# Patient Record
Sex: Female | Born: 1970 | Race: Black or African American | Hispanic: No | Marital: Married | Smoking: Former smoker
Health system: Southern US, Community
[De-identification: ages and names within clinical notes are randomized; demographics above are authoritative.]

## PROBLEM LIST (undated history)

## (undated) DIAGNOSIS — K5090 Crohn's disease, unspecified, without complications: Secondary | ICD-10-CM

## (undated) DIAGNOSIS — D649 Anemia, unspecified: Secondary | ICD-10-CM

## (undated) DIAGNOSIS — F419 Anxiety disorder, unspecified: Secondary | ICD-10-CM

## (undated) DIAGNOSIS — G4700 Insomnia, unspecified: Secondary | ICD-10-CM

## (undated) DIAGNOSIS — K529 Noninfective gastroenteritis and colitis, unspecified: Secondary | ICD-10-CM

## (undated) DIAGNOSIS — G8929 Other chronic pain: Secondary | ICD-10-CM

## (undated) DIAGNOSIS — F32A Depression, unspecified: Secondary | ICD-10-CM

## (undated) HISTORY — PX: INDUCED ABORTION: SHX677

## (undated) HISTORY — DX: Anemia, unspecified: D64.9

## (undated) HISTORY — DX: Anxiety disorder, unspecified: F41.9

## (undated) HISTORY — PX: MOUTH SURGERY: SHX715

## (undated) HISTORY — DX: Depression, unspecified: F32.A

## (undated) HISTORY — DX: Insomnia, unspecified: G47.00

## (undated) HISTORY — DX: Noninfective gastroenteritis and colitis, unspecified: K52.9

## (undated) HISTORY — PX: ABDOMINAL HYSTERECTOMY: SHX81

## (undated) HISTORY — PX: COLONOSCOPY W/ BIOPSIES: SHX1374

## (undated) HISTORY — PX: COLON SURGERY: SHX602

## (undated) HISTORY — PX: UPPER GASTROINTESTINAL ENDOSCOPY: SHX188

## (undated) HISTORY — DX: Crohn's disease, unspecified, without complications: K50.90

## (undated) HISTORY — DX: Other chronic pain: G89.29

---

## 1999-11-20 ENCOUNTER — Other Ambulatory Visit: Admission: RE | Admit: 1999-11-20 | Discharge: 1999-11-20 | Payer: Self-pay | Admitting: Obstetrics

## 2000-07-23 ENCOUNTER — Inpatient Hospital Stay (HOSPITAL_COMMUNITY): Admission: AD | Admit: 2000-07-23 | Discharge: 2000-07-26 | Payer: Self-pay | Admitting: Obstetrics and Gynecology

## 2003-05-10 ENCOUNTER — Other Ambulatory Visit: Admission: RE | Admit: 2003-05-10 | Discharge: 2003-05-10 | Payer: Self-pay | Admitting: Obstetrics and Gynecology

## 2003-10-12 ENCOUNTER — Ambulatory Visit (HOSPITAL_COMMUNITY): Admission: RE | Admit: 2003-10-12 | Discharge: 2003-10-12 | Payer: Self-pay | Admitting: Obstetrics and Gynecology

## 2003-11-09 ENCOUNTER — Ambulatory Visit (HOSPITAL_COMMUNITY): Admission: RE | Admit: 2003-11-09 | Discharge: 2003-11-09 | Payer: Self-pay | Admitting: Obstetrics and Gynecology

## 2004-02-01 ENCOUNTER — Encounter (HOSPITAL_COMMUNITY): Admission: AD | Admit: 2004-02-01 | Discharge: 2004-03-02 | Payer: Self-pay | Admitting: Obstetrics and Gynecology

## 2004-02-01 ENCOUNTER — Ambulatory Visit (HOSPITAL_COMMUNITY): Admission: RE | Admit: 2004-02-01 | Discharge: 2004-02-01 | Payer: Self-pay | Admitting: Obstetrics and Gynecology

## 2004-02-12 ENCOUNTER — Ambulatory Visit (HOSPITAL_COMMUNITY): Admission: RE | Admit: 2004-02-12 | Discharge: 2004-02-12 | Payer: Self-pay | Admitting: Obstetrics and Gynecology

## 2004-02-25 ENCOUNTER — Encounter: Payer: Self-pay | Admitting: Obstetrics and Gynecology

## 2004-02-25 ENCOUNTER — Inpatient Hospital Stay (HOSPITAL_COMMUNITY): Admission: AD | Admit: 2004-02-25 | Discharge: 2004-02-27 | Payer: Self-pay | Admitting: Obstetrics and Gynecology

## 2004-02-26 ENCOUNTER — Encounter: Payer: Self-pay | Admitting: Internal Medicine

## 2004-03-19 ENCOUNTER — Encounter (INDEPENDENT_AMBULATORY_CARE_PROVIDER_SITE_OTHER): Payer: Self-pay | Admitting: *Deleted

## 2004-03-19 ENCOUNTER — Inpatient Hospital Stay (HOSPITAL_COMMUNITY): Admission: RE | Admit: 2004-03-19 | Discharge: 2004-03-22 | Payer: Self-pay | Admitting: Obstetrics and Gynecology

## 2005-01-24 ENCOUNTER — Ambulatory Visit (HOSPITAL_COMMUNITY): Admission: RE | Admit: 2005-01-24 | Discharge: 2005-01-24 | Payer: Self-pay | Admitting: Obstetrics and Gynecology

## 2005-02-17 ENCOUNTER — Other Ambulatory Visit: Admission: RE | Admit: 2005-02-17 | Discharge: 2005-02-17 | Payer: Self-pay | Admitting: Obstetrics and Gynecology

## 2005-05-29 ENCOUNTER — Emergency Department (HOSPITAL_COMMUNITY): Admission: EM | Admit: 2005-05-29 | Discharge: 2005-05-29 | Payer: Self-pay | Admitting: Emergency Medicine

## 2005-08-20 ENCOUNTER — Inpatient Hospital Stay (HOSPITAL_COMMUNITY): Admission: RE | Admit: 2005-08-20 | Discharge: 2005-08-23 | Payer: Self-pay | Admitting: Obstetrics and Gynecology

## 2006-09-22 DIAGNOSIS — G510 Bell's palsy: Secondary | ICD-10-CM | POA: Insufficient documentation

## 2007-07-30 ENCOUNTER — Emergency Department (HOSPITAL_COMMUNITY): Admission: EM | Admit: 2007-07-30 | Discharge: 2007-07-30 | Payer: Self-pay | Admitting: Emergency Medicine

## 2008-02-12 ENCOUNTER — Emergency Department (HOSPITAL_COMMUNITY): Admission: EM | Admit: 2008-02-12 | Discharge: 2008-02-12 | Payer: Self-pay | Admitting: Emergency Medicine

## 2008-02-13 ENCOUNTER — Emergency Department (HOSPITAL_COMMUNITY): Admission: EM | Admit: 2008-02-13 | Discharge: 2008-02-14 | Payer: Self-pay | Admitting: Emergency Medicine
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
## 2010-07-28 ENCOUNTER — Encounter: Payer: Self-pay | Admitting: Obstetrics and Gynecology

## 2010-11-22 NOTE — Consult Note (Signed)
NAMEJASMIA, [name]                  ACCOUNT NO.:  [id number]   MEDICAL RECORD NO.:  [id number]                   PATIENT TYPE:  INP   LOCATION:  9372                                 [facility]   PHYSICIAN:  [place] Bing, M.D.               DATE OF BIRTH:  Oct 08, 1970   DATE OF CONSULTATION:  02/26/2004  DATE OF DISCHARGE:                                   CONSULTATION   REFERRING PHYSICIAN:  Dr. Jaymes Graff.   HISTORY OF PRESENT ILLNESS:  A 40 year old woman with 34-week intrauterine  pregnancy referred for evaluation of chest pressure, dyspnea, and  palpitations.  [name] has no history of cardiac disease.  Her  exercise capacity was normal prior to pregnancy.  She is carrying twins and  has had nonspecific symptoms for some months with decreased exercise  tolerance, increased fatigue, increased dyspnea on exertion, episodic chest  tightness associated with palpitations, headache, backache, and abdominal  pain.  These became more frequent and severe on Sunday, prompting her to  call the nurse practitioner, who recommended admission.   There is no history of cardiac disease or any significant cardiac testing.  She has not previously been seen by a cardiologist.  She has no  hypertension, no diabetes, and no known hyperlipidemia.  She has never had  witnessed episodes of palpitations - no arrhythmia was noted.   She has no known allergies.  Her only medications prior to admission were  Valtrex and vitamins.   PAST MEDICAL HISTORY:  Notable for migraines, irritable bowel, and herpes  simplex 2 infection.  She had asthma as a child.  Her only surgeries have  been a previous C-section and oral surgery.   SOCIAL HISTORY:  Lives in [place] with her husband.  Works for [employer].  Denies tobacco or alcohol use.   FAMILY HISTORY:  Mother has hypertension.  No history of diabetes, coronary  disease, or sudden death.   REVIEW OF SYSTEMS:  Notable for  intermittent episodes of diaphoresis,  frequent headaches, urinary frequency, and mild GERD symptoms.  All other  systems reviewed and are negative.   PHYSICAL EXAMINATION:  GENERAL:  Well-appearing, well-proportioned woman in  no acute distress.  VITAL SIGNS:  The heart rate is 90 and regular, respirations 20, blood  pressure 105/50, temperature 98.  HEENT:  Anicteric sclerae; normal lids and conjunctivae.  NECK:  No jugular venous distention; normal carotid upstrokes without  bruits.  ENDOCRINE:  No thyromegaly.  HEMATOPOIETIC:  No cervical, axillary, or inguinal adenopathy.  LUNGS:  Clear.  CARDIAC:  Normal first and second heart sounds.  Basilar 2/6 systolic  ejection murmur.  ABDOMEN:  Gravid uterus.  Firm and nontender.  EXTREMITIES:  No edema.  Normal distal pulses.  NEUROMUSCULAR:  Symmetric strength and tone.   EKG:  Sinus tachycardia; otherwise, normal.   Spiral CT of the chest:  No evidence for pulmonary embolism; small  pericardial effusion and bilateral pleural effusions.  Other laboratory notable for hemoglobin of 10, potassium of 3.4, creatinine  of 0.6, glucose of 100, and TSH of 1.8.   Preliminary echocardiogram report apparently shows normal left ventricular  systolic function.   IMPRESSION:  Ms. [name] presents with palpitations which have been  demonstrated not to represent a significant arrhythmia.  She has symptoms of  possible cardiomyopathy, but left ventricular systolic function is normal.  She is left with small pleural effusions, probably an insignificant  pericardial effusion, chest discomfort, and dyspnea.  The differential is  broad, but most potentially serious etiologies have been excluded.  Coronary  disease is remote and likely would be congenital if present.  A CPK level  and troponin level will be obtained.  There is also the possibility of an  early perimyocarditis.  This does not usually present with chest tightness  unless congestive  heart failure is present.   Symptoms are potentially consistent with coronary artery spasm.  It would be  useful to record an EKG during chest tightness.  Nitroglycerin can be given  in the way of a therapeutic trial.  This would also be effective for  esophageal spasm.  A BNP level will also be obtained.  CHF appears quite  unlikely in the absence of suggestive physical findings or pulmonary edema  on CT scanning.   The possibility exists of an inflammatory disease.  ANA, rheumatoid factor,  and ESR will be obtained.  Due to dyspnea, arterial blood gas on room air  will be obtained.  If results of this testing are unrevealing, as expected,  consideration can be given to discharge in the morning.  We will plan to see  her again tomorrow.                                               [place] Bing, M.D.    RR/MEDQ  D:  02/26/2004  T:  02/26/2004  Job:  [id number]

## 2010-11-22 NOTE — Discharge Summary (Signed)
NAMEMARLANE, HIRSCHMANN                  ACCOUNT NO.:  [id number]   MEDICAL RECORD NO.:  [id number]                   PATIENT TYPE:  INP   LOCATION:  9104                                 [facility]   PHYSICIAN:  Osborn Coho, M.D.                DATE OF BIRTH:  [birth year]   DATE OF ADMISSION:  03/19/2004  DATE OF DISCHARGE:                                 DISCHARGE SUMMARY   ADMITTING DIAGNOSES:  1.  Intrauterine pregnancy with twins at term.  2.  Previous cesarean section with desire for repeat.   DISCHARGE DIAGNOSES:  1.  Intrauterine pregnancy at term with twins.  2.  Prior cesarean section, desired repeat.  3.  Fibroid.  4.  Anemia without hemodynamic compromise.   PROCEDURES:  Repeat low transverse cesarean section.   HOSPITAL COURSE:  Ms. Crookshanks is a 40 year old gravida 4 para 1-0-2-1 at  52 weeks who presented for scheduled repeat cesarean section secondary to  twin pregnancy.  The patient's history was remarkable for:  1.  Previous cesarean section.  2.  Twin gestation.  3.  History of HSV.  4.  History of migraines.  5.  History of asthma.   The patient was taken to the operating room on March 19, 2004 where a  repeat low transverse cesarean section was performed by Dr. Marline Backbone  under spinal anesthesia.  Findings were a viable twin pregnancy.  Twin A was  a female by the name of Ahijah, 6 pounds 6 ounces, Apgars were 9 and 9.  Twin  B was a female, Rolm Gala, weight 7 pounds 4 ounces, and Apgars were 8 and 9.  There was a 4 cm fibroid noted.  Normal tubes and ovaries were noted.  Estimated blood loss was 800 mL.  The patient tolerated the procedure well  and was taken to the recovery room in good condition.  The infants were  taken to full-term nursery.  The patient elected breastfeeding.  By  postoperative day #1 the patient was doing well.  She was up ad lib.  Her  hemoglobin was 8.1, down from 9.8.  She had no dizziness or syncope.   She  declined transfusion.  She was breastfeeding.  The patient declined hormonal  contraception.  During the rest of her course, her physical exam was within  normal limits, her vital signs were stable.  By postoperative day #3 she was  up ad lib, she was passing gas and tolerating a regular diet.  She had had a  JP drain that drained approximately 20 mL per 24-hour shift.  This was  removed on postoperative day #3 prior to discharge.  The patient's incision  was clean, dry, and intact with Steri-Strips.  She had subcuticular sutures  in place.  The rest of her physical exam was within normal limits.  She was  deemed to have received the full benefit of her hospital stay and was  discharged home.  DISCHARGE INSTRUCTIONS:  Per [facility] handout.   DISCHARGE MEDICATIONS:  1.  Motrin 600 mg p.o. q.6h. p.r.n. pain.  2.  Tylox one to two p.o. q.4-6h. p.r.n. pain.  3.  The patient is also going to use vitamin B6 to assist with elimination      of excess fluid.   DISCHARGE FOLLOW-UP:  In 6 weeks at [facility].   CONDITION ON DISCHARGE:  Stable.     Renaldo Reel Emilee Hero, C.N.M.                   Osborn Coho, M.D.    VLL/MEDQ  D:  03/22/2004  T:  03/22/2004  Job:  [id number]

## 2010-11-22 NOTE — H&P (Signed)
[name], [name]        ACCOUNT NO.:  [id number]   MEDICAL RECORD NO.:  [id number]          PATIENT TYPE:  INP   LOCATION:  NA                            [facility]   PHYSICIAN:  Janine Limbo, M.D.DATE OF BIRTH:  1971/04/11   DATE OF ADMISSION:  08/22/2005  DATE OF DISCHARGE:                                HISTORY & PHYSICAL   HISTORY OF PRESENT ILLNESS:  The patient is a 40 year old female, gravida 5,  para 2-0-2-3, who presents at 9 weeks' gestation ([facility] is September 02, 2005.) The patient has been followed at the [facility].  She presents for a  repeat cesarean section.  The patient has a history of two prior cesarean  deliveries.  This pregnancy has been complicated by Bell palsy that is  slowly resolving.   OBSTETRICAL HISTORY:  1.  The patient had a cesarean section, in 2002, at term where she delivered      an 8 pound 10 ounce female infant (history of HSV outbreak.)  2.  In 2005, the patient had a cesarean section at term where she delivered      twins.  3.  The patient had a first trimester miscarriage in 2004.  4.  She had a first trimester pregnancy termination in 1995.   DRUG ALLERGIES:  No known drug allergies.   SOCIAL HISTORY:  The patient denies cigarette use, alcohol use, and  recreational drug use.   REVIEW OF SYSTEMS:  Normal pregnancy complaints.   PAST MEDICAL HISTORY:  1.  The patient has Bell palsy as mentioned above.  2.  She has a history of HSV.  3.  She also has a history of asthma.  4.  She has had two cesarean deliveries as previously discussed.  5.  She has a history of irritable bowel syndrome.  6.  She has a history of migraines.   FAMILY HISTORY:  The patient's mother and her sister have diabetes.  Her  mother has hypertension.   PHYSICAL EXAMINATION:  VITAL SIGNS:  Weight is 214 pounds, height is 6 feet.  HEENT:  Within normal limits.  CHEST:   Clear.  HEART:  Regular rate and rhythm.  BREASTS:  Without masses.  ABDOMEN:  Gravid and the fundal height is 37-cm.  EXTREMITIES:  Within normal limits.  NEUROLOGIC:  Grossly normal.  PELVIC:  Cervix was closed and long when last checked.   LABORATORY VALUES:  Blood type is B positive.  Antibody screen negative.  Sickle cell negative.  VDRL nonreactive.  Rubella immune.  HBSAG negative.  Pap within normal limits.  Third trimester beta strep is negative.  Third  trimester gonorrhea negative.  Third trimester Chlamydia is negative.   ASSESSMENT:  1.  A 38-week gestation.  2.  Two prior cesarean deliveries.  3.  Desires repeat cesarean delivery.   PLAN:  The patient will undergo a repeat low transverse cesarean section.  She understands the indications for her procedure, and she accepts the risk  of, but not limited to, anesthetic complications, bleeding, infection, and  possible  damage to the surrounding organs.      Janine Limbo, M.D.  Electronically Signed     AVS/MEDQ  D:  08/15/2005  T:  08/15/2005  Job:  [id number]

## 2010-11-22 NOTE — Discharge Summary (Signed)
[name]        ACCOUNT NO.:  [id number]   MEDICAL RECORD NO.:  [id number]          PATIENT TYPE:  INP   LOCATION:  9105                          [facility]   PHYSICIAN:  Osborn Coho, M.D.   DATE OF BIRTH:  [birth year]   DATE OF ADMISSION:  08/20/2005  DATE OF DISCHARGE:  08/23/2005                                 DISCHARGE SUMMARY   ADMISSION DIAGNOSES:  1.  Intrauterine pregnancy at 38 weeks.  2.  Active labor.  3.  Prior Cesarean section and desire for repeat Cesarean section.   DISCHARGE DIAGNOSES:  1.  Intrauterine pregnancy at 38 weeks.  2.  Active labor.  3.  Prior Cesarean section and desire for repeat Cesarean section.   PROCEDURE:  Repeat low transverse Cesarean section.   HOSPITAL COURSE:  [name] is a 40 year old married black female,  gravida 5, para 2-0-2-3, who was admitted at 38-1/7 weeks from [place] in active labor.  She denies leaking or bleeding.  Her  cervix was 5 cm on examination in the office.  There were no current HSV  signs or symptoms.  Her pregnancy has been followed by the [facility] service and had been remarkable for (1) history of previous Cesarean  section X2, the second Cesarean section for twins, (2) first trimester  spotting, (3) questionable last menstrual period, (4) history of HSV2, (5)  history of irritable bowel syndrome, (6) migraines.  Upon arrival to [facility] the patient was prepped for Cesarean section and was taken to the  operating room.  The patient tolerated the procedure well.  Infant was a  viable female by the name of [name] with Apgar's of 9 and 9 and weight of 7  pounds, 6 ounces.  He was taken to the full term nursery and was doing well.  The patient was taken to the recovery room in good condition.  By  postoperative day #1, her vital signs were stable.  Her hemoglobin was 8.3,  and had been 10.1 preoperatively.  Physical examination was normal.  The  patient was breastfeeding.  Her postoperative day #2 was uneventful.  By day  #3 the patient's JP drain was removed without incident.  She was deemed to  have received the full benefit of her hospital stay and was discharged home.  The patient expressed desire for oral contraceptives for birth control.   DISCHARGE MEDICATIONS:  1.  Motrin 600 mg one p.o. q.6h. PRN pain.  2.  Tylox one to two p.o. q.3-4h. PRN pain.  3.  Prenatal vitamins one p.o. daily.  4.  Micronor one p.o. daily to start two Sunday's after delivery.   DISCHARGE INSTRUCTIONS:  Per [facility] handout.   FOLLOW UP:  Discharge follow up will occur at [facility] in  four to six weeks postpartum or as needed.      Cam Hai, C.N.M.      Osborn Coho, M.D.  Electronically Signed    KS/MEDQ  D:  08/23/2005  T:  08/23/2005  Job:  [id number]

## 2010-11-22 NOTE — H&P (Signed)
NAMEJANEISHA, RYLE                  ACCOUNT NO.:  [id number]   MEDICAL RECORD NO.:  [id number]                   PATIENT TYPE:  OUT   LOCATION:  CATS                                 FACILITY:  [facility]   PHYSICIAN:  Naima A. Dillard, M.D.              DATE OF BIRTH:  09-13-1970   DATE OF ADMISSION:  02/25/2004  DATE OF DISCHARGE:                                HISTORY & PHYSICAL   HISTORY OF PRESENT ILLNESS:  Ms. Truszkowski is a 40 year old gravida 4,  para 1-0-2-1. She is at 34-1/[redacted] weeks gestation with twins. She presents  today with occasional episodes of chest tightness, shortness of breath,  headache, and heart palpitations since Friday, February 22, 2004. She has had  an increase in these number of episodes today and called in to report these  symptoms at 12:30 this afternoon. She has not previously had any symptoms  such as she is having now. She dose not have any upper respiratory infection  or cold symptoms. She does not have any nausea, vomiting or diarrhea. She is  having a normal daily bowel movement and eating normally. She does  experience some gastric reflux symptoms and takes Tums appropriately. She  has had a recent HSV outbreak, starting on Friday, February 22, 2004.  Otherwise, she denies any continuous contractions, leaking of fluid, vaginal  bleeding, or anxiety. She has been on bedrest at home for advanced cervical  dilation. On Friday, February 22, 2004 she was noted to be 2 cm dilated and  60% effaced. She describes these episodes as beginning with chest tightness  and shortness of breath. Soon after the chest tightness and shortness of  breath, the patient begins to have heart palpitations and can hear and feel  her heartbeat pounding in her chest. Then her head begins to pound as she  gets a headache with heartbeat again pounding in her head. Following the  headache, she begins to have abdominal cramping, which then radiates to her  back/her tail bone area.  This area begins pulsating and pounding like a  heartbeat in her tail bone area and then increases throughout her back to  the middle of her back, at which point the symptoms all seem to fade away.  The combination of all of these symptoms in one episodes lasts approximately  1 minute. She began having these episodes on Friday a couple of times and  several more on Saturday and the frequency of these episodes has increased  today. The patient is currently not shortness of breath at rest. Her babies  are both reactive on monitor. She is contracting very occasionally,  approximately every 20 to 40 minutes. Her O2 saturation on room air has been  93% to 96%.   GYNECOLOGIC HISTORY:  Her pregnancy has been followed by the MD Service at  [facility] and Gynecologic Services and is remarkable for:  1. Migraines.  2. Previous cesarean section.  3. HSV-2.  4.  Vegetarian.  5. Question last menstrual period.  6. Twins.  This patient began prenatal care at [facility] and  Gynecologic Services on October 03, 2003. Estimated date of confinement  determined by ultrasound which noted twin gestation at 14 weeks 5 days with  an estimated date of confinement of April 06, 2004. Her pregnancy has been  complicated with migraine headaches. She has had neurology consult and at  the present time, she has not had migraine headaches in greater than 1  month. She has had increased pelvic pain and pressure throughout her  pregnancy. She had contractions and a fetal fibronectin, which was negative  on January 11, 2004. She was seen on February 01, 2004 at which point she had an  ultrasound showing dichorionic diamniotic twins. Twin A had an estimated  fetal weight of 1772 grams at the 75th to 90th percentile with normal fluid,  vertex. Twin B had an estimated fetal weight of 1837 at the 75th to 90th  percentile and also had normal fluid. Twin B was also vertex. Her cervix at  that time was  measured at 1.8 cm. She received betamethasone on that day and  the following day, 24 hours later. It was at that point that she was begun  on bedrest. She has been normotensive throughout her pregnancy with no  proteinuria.   OBSTETRIC HISTORY:  In 1995, the patient had a first trimester elective AB.  In 2002, the patient had a low transverse cesarean section at term with the  birth of an 8 pound 10 ounce female infant named Hospital doctor. She had the cesarean  section secondary to an HSV outbreak. In 2004, the patient had a first  trimester SAB and the current pregnancy with twin gestation.   PAST MEDICAL HISTORY:  The patient has a history of HSV-2 and is currently  taking Valtrex for an outbreak, which occurred on February 22, 2004. She has a  history of irritable bowel syndrome and migraines. She had a history of  asthma as a child.   FAMILY HISTORY:  The patient's mother with chronic hypertension. Mother,  sister with diabetes. Maternal grandmother with uterine cancer.   GENETIC HISTORY:  Unremarkable. There is no family history of genetic or  chromosomal disorders, children that died in infancy or that were born with  birth defects.   ALLERGIES:  No known drug allergies.   SOCIAL HISTORY:  She denies the use of tobacco, alcohol, or illicit drugs.   SOCIAL HISTORY:  Ms. Yetman is a married African-American female. Her  husband, Fidela Juneau is involved and supportive. They do not  subscribe to a religious faith.   REVIEW OF SYSTEMS:  Is as described above. The patient has experienced chest  pain, shortness of breath, contractions and headache.   PHYSICAL EXAMINATION:  VITAL SIGNS:  Temperature 98.4, pulse 90 to 110 and  respiratory rate 20. Her O2 saturation on room air is 93% to 96%.  HEENT:  Unremarkable.  HEART:  Per EKG, shows sinus tachycardia.  LUNGS:  Clear bilaterally. ABDOMEN:  Gravid in its contour. Uterine fundus is noted to extend 38 cm  above the level of  the pubic synthesis. The patient is known to have a twin  gestation on previous ultrasound. Babies were in a vertex/vertex position.  Fetal heart rate per monitoring:  Twin A baseline is 150's with average long  term variability. Baby is reactive. Twin B baseline is 140 to 150, positive  variability and is also  reactive. The patient is contracting very  irregularly and mildly approximately every 20 to 40 minutes. Abdomen is soft  and nontender. CVAT is negative bilaterally.  EXTREMITIES:  She has no edema. Deep tendon reflexes are 1+ with no clonus.  She has no calf tenderness.  PELVIC:  Digital examination of the cervix finds it to be 2 cm dilated, 60%  effaced with a presenting part high in the pelvis.   ASSESSMENT:  1. Intrauterine pregnancy at 34-1/7 weeks with chest pain, shortness of     breath, contractions and headache episodes.  2. Twins.  3. Active HSV outbreak.   PLAN:  Admit to Intensive Care Unit per Dr. Jaymes Graff with orders as  written. Lab work has been ordered as well as ultrasound and spiral CT.     Rica Koyanagi, C.N.M.               Naima A. Normand Sloop, M.D.    SDM/MEDQ  D:  02/25/2004  T:  02/26/2004  Job:  [id number]

## 2010-11-22 NOTE — Discharge Summary (Signed)
[facility]  Patient:    [name], [name]                 MRN: [id number] Adm. Date:  [id number] Disc. Date: 07/26/00 Attending:  Shaune Spittle Dictator:   Mack Guise, C.N.M.                           Discharge Summary  ADMITTING DIAGNOSES:          1. Term pregnancy, active labor.                               2. Herpes simplex virus.  DISCHARGE DIAGNOSES:          1. Term pregnancy, delivered.                               2. Primary low transverse cesarean section.                               3. Herpes simplex virus.                               4. Fibroids.  PROCEDURE:                    Primary low transverse cesarean delivery.  HISTORY/HOSPITAL COURSE:      [name] is a 40 year old, gravida 2 , para 0-0-1-0 at term, who presents in early active labor with history of HSV and a questionable lesion the day prior to labor in the office. A decision was made to undergo primary low transverse cesarean delivery in light of questionable lesion. She underwent primary low transverse cesarean delivery on July 23, 2000 by Dr. Marline Backbone with the birth of an 8-pound 10-ounce female infant, [name], with Apgar scores of 8 at one minute and 9 at five minutes.  She has done well in the postoperative period. Her hemoglobin on the first postoperative day was 9.4 and on this, her third postoperative day, she is judged to be in satisfactory condition for discharge.  DISCHARGE INSTRUCTIONS:       Per [facility] handout.  DISCHARGE MEDICATIONS:        1. Motrin 600 mg p.o. q.6h. p.r.n. pain.                               2. Tylox one to two p.o. q.3-4h. pain.                               3. Prenatal vitamins.                               4. Patient plans foam and condoms for                                  contraception.  FOLLOWUP:                     She will be  followed in six weeks postoperatively in the  office of [facility]. DD:  07/26/00 TD:  07/26/00 Job: [id number]

## 2010-11-22 NOTE — Op Note (Signed)
[facility]  Patient:    [name], [name]                 MRN: [id number] Proc. Date: 07/23/00 Adm. Date:  [id number] Disc. Date: [id number] Attending:  Dierdre Forth Pearline                           Operative Report  PREOPERATIVE DIAGNOSES:       1. Term intrauterine pregnancy.                               2. Active labor.                               3. Suspicious herpes simplex virus outbreak.   POSTOPERATIVE DIAGNOSES:      1. Term intrauterine pregnancy.                               2. Active labor.                               3. Suspicious herpes simplex virus outbreak.                               4. Fibroid uterus.  PROCEDURE:                    Primary low transverse cesarean section.  SURGEON:                      Janine Limbo, M.D.  FIRST ASSISTANT:              Mack Guise, C.N.M.  ANESTHESIA:                   Spinal.  DISPOSITION:                  The patient is a 40 year old female, gravida 2, para 0-0-1-0, who presents at [redacted] weeks gestation (EDC is July 24, 2000) in active labor.  The patient has a history of of HSV, and she has a suspicious HSV outbreak.  She understands the indications for her cesarean delivery, and she accepts the risks of, but not limited to, anesthetic complications, bleeding, infection, and possible damage to the surrounding organs.  FINDINGS:                     An 8 pounds 10 ounce female infant Glenda Chroman) was delivered.  The Apgars were 8 at one minute and 9 at five minutes.  There was a 4 cm fibroid present on the left anterior uterus and a 3 cm fibroid present on the left fundus.  The fallopian tubes and ovaries were normal.  DESCRIPTION OF PROCEDURE:     The patient was taken to the operating room where a spinal anesthetic was given.  The patients abdomen, perineum, and outer vaginal were prepped with multiple layers of Betadine.  A Foley catheter catheter was placed in the  bladder.  The patient was sterilely draped.  A low transverse incision was made in the abdomen and carried sharply through the subcutaneous tissue, the fascia,  and the anterior peritoneum.  An incision was made in the lower uterine segment and extended transversely.  The fetal head delivered.  The mouth and nose were suctioned using a DeLee trap and the bulb suction because of light meconium fluid.  The remainder of the infant was delivered, and the infant was handed to the waiting pediatric team.  Routine core blood studies were obtained.  The placenta was removed.  The uterine cavity was cleaned of amniotic fluid and clotted blood.  The uterine incision was closed using a running locking suture of 2-0 Vicryl.  Hemostasis was adequate.  The peritoneal cavity was vigorously irrigated.  The abdominal musculature and the anterior peritoneum were reapproximated in the midline using 2-0 Vicryl.  The abdominal musculature and the fascia were irrigated, and the hemostasis was adequate.  The fascia was closed using a running suture of 0 Vicryl followed by three interrupted sutures of 0 Vicryl.  The subcutaneous tissue was irrigated.  Hemostasis was achieved using the Bovie cautery.  The subcutaneous layer was closed using a running suture of 3-0 Vicryl.  The skin was reapproximated using skin staples.  Sponge, needle and instrument counts were correct on two occasions.  The estimated blood loss was 700 cc.  The patient tolerated the procedure well.  She was taken to the recovery room in stable condition.  The infant was taken to the full term nursery in stable condition. DD:  07/23/00 TD:  07/24/00 Job: [id number]

## 2010-11-22 NOTE — Discharge Summary (Signed)
NAMEKRISTYNE, Bell                  ACCOUNT NO.:  [id number]   MEDICAL RECORD NO.:  [id number]                   PATIENT TYPE:  OUT   LOCATION:  CATS                                 FACILITY:  [facility]   PHYSICIAN:  Osborn Coho, M.D.                DATE OF BIRTH:  06/29/1971   DATE OF ADMISSION:  02/25/2004  DATE OF DISCHARGE:  02/27/2004                                 DISCHARGE SUMMARY   ADMITTING DIAGNOSES:  1. Intrauterine pregnancy at 34-1/7 weeks with twin gestation.  2. Chest pain, shortness of breath, contractions, and headache episodes.  3. Active herpes simplex virus outbreak.   DISCHARGE DIAGNOSES:  1. Twins a 34-2/7 weeks.  2. Shortened cervix of 2 cm dilated.  3. Shortness of breath, chest pain, with no evidence of pulmonary embolus or     cardiomyopathy.  4. Pericardial and pleural effusions.   PROCEDURES:  None.   HOSPITAL COURSE:  Ms. [name] is a 40 year old gravida 4, para  1-0-2-1 at 34-1/7 weeks with twins who was admitted on 02/25/04 with episodes  of chest pain and shortness of breath.  She denied any other symptoms.  She  has had a recent HSV outbreak since Friday, 8/18.  Her history had been  remarkable for:   1. Migraines.  2. Previous cesarean section.  3. HSV 2 with current outbreak.  4. Preterm labor with cervix 2 cm, 60% on Friday, 8/18.  5. Questionable last menstrual period.  6. Twin gestation.   On admission, her cervix was 2 cm, 60% with presenting part high.  Fetuses  were in the vertex-vertex presentation.  Twin fetal heart tones were  reactive.  O2 saturation was 93-96% on room air.  Other vital signs were  stable.  EKG showed sinus tachycardia.  Lungs were clear.   The patient was admitted for observation and a spiral CT and cardiology  workup.  The spiral CT showed no PE.  There were bilateral small pleural  effusions and small pericardial effusion.  Twins were within normal limits  for position and growth.   Cervix was 2.4 cm.  TSH was normal.  Hemoglobin  was 9.9.  Potassium was normal.  BUN was normal.  Alkaline phosphatase was  643 which is probably placental.  Total protein and albumin were slightly  abnormal which may be nutritional since the patient is a vegetarian.  A 2-D  echocardiogram was done.  A cardiology consult was obtained by [facility]  cardiology.  Her overall evaluation with echocardiogram and CT was reviewed  showed minimal effusions, normal labs, and no clear cardiopulmonary disease.  The patient was seen each day of her hospital stay by cardiology and on  02/27/04, she had a ABGs done which were normal and she was felt stable  enough to be discharged home.  Dr.  Su Hilt saw the patient on 8/23.  O2  saturations were 97-98% on room air.  Vital signs were stable.  Urine  output  was good.  There were no contractions and fetal heart rates were reactive x2  on NST.  She was still having some episodes of mild chest tightness.  She  was determined to have received full benefit of her hospital stay and was  discharged home.   DISCHARGE INSTRUCTIONS:  The patient is to continue bed rest.  Signs and  symptoms of elevated blood pressure and worsening shortness of breath and  chest pain were reviewed with the patient.   PRESCRIPTION MEDICATIONS:  Prenatal vitamin, one p.o. daily.   DISCHARGE FOLLOWUP:  Will occur as scheduled at [facility] on this  Friday as scheduled and follow up with cardiology as needed.     Renaldo Reel Emilee Hero, C.N.M.                   Osborn Coho, M.D.    VLL/MEDQ  D:  02/27/2004  T:  02/28/2004  Job:  [id number]

## 2010-11-22 NOTE — Op Note (Signed)
NAMEGIRLIE, VELTRI        ACCOUNT NO.:  [id number]   MEDICAL RECORD NO.:  [id number]          PATIENT TYPE:  INP   LOCATION:  9105                          FACILITY:  [facility]   PHYSICIAN:  Hal Morales, M.D.DATE OF BIRTH:  1970/08/14   DATE OF PROCEDURE:  08/20/2005  DATE OF DISCHARGE:                                 OPERATIVE REPORT   PREOPERATIVE DIAGNOSIS:  Intrauterine pregnancy at 71 weeks' gestation,  active labor, prior cesarean section, desire for repeat cesarean section.   POSTOPERATIVE DIAGNOSES:  Intrauterine pregnancy at 1 weeks' gestation,  active labor, prior cesarean section, desire for repeat cesarean section.   OPERATION:  Repeat low transverse cesarean section.   SURGEON:  Hal Morales, M.D.   FIRST ASSISTANT:  Rhona Leavens, CNM   ANESTHESIA:  Spinal.   ESTIMATED BLOOD LOSS:  Approximately 750 mL.   COMPLICATIONS:  None.   FINDINGS:  The patient was delivered of a female infant whose name is [place]  with Apgars of 09/09. The weight was pending at the time of dictation.   PROCEDURE:  The patient was taken to the operating room after appropriate  identification and placed on the operating table. After the placement of a  spinal anesthetic she was placed in the supine position with a left lateral  tilt. The abdomen and perineum were prepped with multiple layers of Betadine  and a Foley catheter inserted into the bladder and connected to straight  drainage. The abdomen was draped as a sterile field. The area over the  previous cesarean section incision was infiltrated with 0.25% Marcaine for  total of 20 mL once adequate anesthesia had been assured. A transverse  incision was made at the site of the previous cesarean section incision and  the abdomen opened in layers. Peritoneum was entered and the bladder blade  placed. The bladder flap was created by incising the serosa at the  uterovesical fold and the bladder advanced. The uterus was then  incised in  the lower uterine segment and that incision taken laterally bluntly. The  membranes were ruptured with clear fluid noted and the infant delivered from  the occiput transverse position. The nares and pharynx were suctioned and  cord clamped and cut. The infant was then handed off to the awaiting  pediatricians. The appropriate cord blood was drawn and the placenta noted  to have separated from the uterus and was removed from the operative field.  The uterine cavity was cleared of products of conception and the uterine  incision closed with a running interlocking suture of 0 Vicryl then an  imbricating suture of 0 Vicryl was placed. A hemostatic suture of 0 Vicryl  in figure-of-eight configuration allowed for adequate hemostasis. Copious  irrigation was carried out. The tubes and ovaries were noted to be normal  for the gravid state. The abdominal peritoneum was then closed with a  running suture of 2-0 Vicryl. The rectus muscles were reapproximated in  midline with figure-of-eight suture of 2-0 Vicryl. The rectus fascia was  closed with running suture of 0 Vicryl and reinforced on either side of  midline with figure-of-eight sutures of  0 Vicryl. The subcutaneous tissue  was irrigated and made hemostatic with Bovie cautery.  A 7 mm Jackson-Pratt  drain was placed in the subcutaneous space through a stab wound in the left  lower quadrant. It was sewn in place with a suture of 2-0 silk. The skin  incision was then closed with a subcuticular suture. Steri-Strips were  applied to the incision and a sterile dressing applied. The patient was  taken from the operating room to the recovery room in satisfactory condition  having tolerated the procedure well with sponge and instrument counts  correct. The infant went to the full-term nursery.      Hal Morales, M.D.  Electronically Signed     VPH/MEDQ  D:  08/20/2005  T:  08/21/2005  Job:  [id number]

## 2010-11-22 NOTE — Op Note (Signed)
[name], [name]                  ACCOUNT NO.:  [id number]   MEDICAL RECORD NO.:  [id number]                   PATIENT TYPE:  INP   LOCATION:  9104                                 [facility]ACILITY[facility]   PHYSICIAN:  [name], M.D.            DATE OF BIRTH:  07-Apr-1971   DATE OF PROCEDURE:  03/19/2004  DATE OF DISCHARGE:                                 OPERATIVE REPORT   PREOPERATIVE DIAGNOSES:  1.  Term intrauterine pregnancy.  2.  Twin gestation.  3.  Prior cesarean section.  4.  Desires repeat cesarean section.   POSTOPERATIVE DIAGNOSES:  1.  Term intrauterine pregnancy.  2.  Twin gestation.  3.  Prior cesarean section.  4.  Desires repeat cesarean section.   OPERATION PERFORMED:  Repeat low transverse cesarean section.   SURGEON:  [name], M.D.   ASSISTANT:  [name]. [name], C.N.M.   ANESTHESIA:  Spinal.   INDICATIONS FOR PROCEDURE:  [name] is a 40 year old female, gravida  4, para 1-0-2-1, who presents at 37-1/[redacted] weeks gestation.  She has been  followed at the [facility] of  [facility] for this pregnancy that has been complicated  by the fact that she has twins.  The patient understands the indications for  cesarean section and she accepts the risks of, but not limited to anesthetic  complications, bleeding, infections, and possible damage to the surrounding  organs.   FINDINGS:  The first infant born was a female and his name is [name].  He was  a 6 pound, 6 ounce infant with Apgars of 9 at one minute and 9 at five  minutes.  He was in a cephalic presentation.  The second baby was a 7 pound  4 ounce female infant, [name].  That infant was in a vertex presentation.  The Apgars were 8 at one minute and 9 at five minutes.  There was a 4 cm  fibroid present on the anterior portion of the uterus.  There were several  smaller fibroids measuring less than 2 cm in size.  The  fallopian tubes and  the ovaries appeared normal for the gravid state.   DESCRIPTION OF PROCEDURE:  The patient was taken to the operating room where  a spinal anesthetic was given.  The patient's abdomen and perineum were  prepped with multiple layers of Betadine.  A Foley catheter was placed in  the bladder.  The patient was sterilely draped.  The lower abdomen was  injected with 10 mL of 0.5% Marcaine with epinephrine.  A low transverse  incision was made and carried sharply through the subcutaneous tissue, the  fascia and the anterior peritoneum.  An incision was made in the lower  uterine segment and the bladder flap was developed.  The incision was  extended in a low transverse fashion.  The fetal head was delivered without  difficulty.  The cord was clamped and  cut and the infant was handed to the  waiting pediatric team.  The membranes were ruptured on the second infant  and that infant was also delivered from the cephalic presentation.  The  mouth and nose were suctioned. The remainder of that infant was delivered.  The cord was clamped and cut and the infant was handed to the waiting  pediatric team.  Routine cord blood studies were obtained from both  umbilical cords.  The placenta was removed and sent to pathology.  The  uterine cavity was cleaned of amniotic fluid, clotted blood and membranes.  The uterine incision was closed using a running locking suture of 2-0 Vicryl  followed by an imbricating suture of 2-0 Vicryl.  Hemostasis was adequate.  The pelvis was vigorously irrigated.  The anterior peritoneum and the  abdominal musculature were reapproximated in the midline using 2-0 Vicryl.  The fascia and the subcutaneous layer were irrigated.  The fascia was closed  using a running suture of 0 Vicryl followed by three interrupted sutures of  0 Vicryl.  A Jackson-Pratt drain was placed in the subcutaneous space and  brought out through the left lower quadrant.  It was sutured  into place  using 2-0 silk.  The subcutaneous layer was closed with 0 Vicryl.  The skin  was reapproximated using a subcuticular suture of 4-0 Vicryl.  Sponge,  needle and instrument counts were correct on two occasions.  The estimated  blood loss for this procedure was 800 mL.  The patient tolerated the  procedure well.  She was taken to the recovery room in stable condition.  Both infants were taken to the full term nursery in stable condition.                                               [name], M.D.    AVS/MEDQ  D:  03/19/2004  T:  03/19/2004  Job:  [id number]

## 2010-11-22 NOTE — H&P (Signed)
[name], [name]                  ACCOUNT NO.:  [id number]   MEDICAL RECORD NO.:  [id number]                   PATIENT TYPE:  INP   LOCATION:  NA                                   [facility]  [facility]   PHYSICIAN:  Janine Limbo, M.D.            DATE OF BIRTH:  [birth year]   DATE OF ADMISSION:  03/19/2004  DATE OF DISCHARGE:                                HISTORY & PHYSICAL   HISTORY OF PRESENT ILLNESS:  The patient is a 40 year old female, gravida 4,  para 1-0-2-1, who presents at 37-1/[redacted] weeks gestation [facility] April 06, 2004)  for repeat cesarean section.  The patient has been followed at [facility] with this pregnancy that has been complicated by the fact that she has  twins.  She also has had a prior cesarean section.  She has a history of  herpes simplex virus.  The patient had considered a vaginal birth after  cesarean section, but she now wishes to proceed with repeat cesarean  section.   OBSTETRICAL HISTORY:  1.  The patient had a first trimester elective pregnancy termination in      1995.  2.  In 2002, she had a cesarean section at term where she delivered an 8      pound 10 ounce female infant.  3.  In 2004, the patient had an early miscarriage.   PAST MEDICAL HISTORY:  The patient has a history of migraine headaches.  She  also had asthma as a child but is currently doing well.  She has been told  that she has irritable bowel syndrome.   DRUG ALLERGIES:  No known drug allergies.   SOCIAL HISTORY:  The patient denies cigarette use, alcohol use, and  recreational drug use.   REVIEW OF SYSTEMS:  The patient is a vegetarian.   FAMILY HISTORY:  Noncontributory.   PHYSICAL EXAMINATION:  VITAL SIGNS:  Weight is 224 pounds.  HEENT:  Within normal limits  CHEST:  Clear.  HEART:  Regular rate and rhythm.  BREASTS:  Without masses.  ABDOMEN:  Gravid with a fundal height of 42-43 cm.  PELVIC EXAM:  Cervix was closed and long when last checked.   LABORATORY VALUES:  Blood type is B positive, antibody screen negative,  sickle cell negative, VDRL nonreactive, rubella positive, HbsAg negative,  HIV nonreactive, cystic fibrosis negative.  Third trimester beta strep is  negative.   ASSESSMENT:  1.  At 37-1/[redacted] weeks gestation.  2.  Twin gestation.  3.  Prior cesarean section.   PLAN:  The patient elects to proceed with repeat cesarean section.  She  understands the indications for her induction, and she accepts the  associated risks of, but not limited to, anesthetic complications, bleeding,  infections, and possible damage to the surrounding organs.  Janine Limbo, M.D.    AVS/MEDQ  D:  03/18/2004  T:  03/18/2004  Job:  [id number]

## 2010-11-22 NOTE — H&P (Signed)
[facility]  Patient:    [name], [name]                 MRN: [id number] Adm. Date:  [id number] Attending:  Shaune Spittle Dictator:   Mack Guise, C.N.M.                         History and Physical  HISTORY OF PRESENT ILLNESS:   [name] is a 40 year old gravida 2 para 0-0-1-0 at term, EDD July 24 2000 as determined by early pregnancy ultrasonography on December 13, 1999, confirmed with follow-up ultrasound which confirmed growth and dating.  This patient presents with increasing labor symptoms.  She was seen in the office of [facility] yesterday for some vaginal irritation and burning with urination.  Small questionable lesion was seen at that time with a point of irritation seen by Dr. Marline Backbone on the right labia minora near the urethra.  Patient was started on Valtrex yesterday.  She has a history of HSV.  Her last outbreak was in June 2001.  She reports positive fetal movement, no bleeding, no rupture of membranes.  She denies any headache, visual changes, or epigastric pain.  Her pregnancy has been followed by the M.D. service at [facility] and is remarkable for: 1. History of HSV. 2. Daily headaches. 3. Unsure LMP. 4. Vegetarian. 5. Group B strep negative.  This patient was initially evaluated at the office on [facility] on January 13, 2000 at approximately [redacted] weeks gestation.  Her pregnancy has been complicated by headaches and back pain, for which she took Tylenol No. 3 and Vicodin and was seen at [facility] for physical therapy.  She is currently taking Tylenol No. 3 for her pain and Valtrex.  Throughout the prenatal course the growth of the uterine fundus has been noted to be compatible in size with her dates. Systolic blood pressure has ranged from 110-120.  Diastolic blood pressure has ranged from 60-80.  There has been no significant proteinuria.  OBSTETRICAL HISTORY:          In 1995, induced AB, and the present  pregnancy.  MEDICAL HISTORY:              History HSV.  History of asthma diagnosed at one time in childhood.  Patient does not take any medications.  History of irritable bowel syndrome and daily headaches.  FAMILY HISTORY:               Patients mother and maternal grandmother with a history of chronic hypertension, diabetes in patients mother and sister. Patients maternal grandmother has a history of uterine cancer.  GENETIC HISTORY:              There is no family history of familial or genetic disorders, children that died in infancy, or that were born with birth defects.  SOCIAL HISTORY:               [name] is a 39 year old African-American female who works as a Designer, industrial/product.  Her husband, Fidela Juneau, is involved and supportive.  They do not subscribe to a religious faith.  ALLERGIES:                    No known drug allergies.  HABITS:                       Patient was a previous smoker and stopped with a  positive urine pregnancy test, and admitted to drinking alcohol one time a week prior to pregnancy and again stopped with positive pregnancy test. Patient is chronically on pain medicine, having taking Tylenol No. 3 daily prior to pregnancy.  She denies the use of illicit drugs.  REVIEW OF SYSTEMS:            As described above.  PHYSICAL EXAMINATION:  VITAL SIGNS:                  Stable, afebrile.  HEART:                        Regular rate and rhythm.  LUNGS:                        Clear.  ABDOMEN:                      Gravid in its contour.  Uterine fundus is noted to extend 39 cm above the level of the pubic symphysis.  Thayer Ohm maneuvers finds the infant to be in a longitudinal lie, cephalic presentation, and the estimated fetal weight is 7.5 pounds.  PELVIC:                       Digital exam of the cervix finds it to be 5 cm dilated, 90% effaced, with the cephalic presenting part at a -1 station. Fetal heart rate is reactive and  reassuring.  ASSESSMENT:                   Intrauterine pregnancy at term, early labor, questionable HSV lesion.  PLAN:                         Per consult with Dr. Marline Backbone, decision is made to proceed with cesarean delivery in light of suspicious lesions. DD:  07/23/00 TD:  07/23/00 Job: [id number]

## 2011-04-04 LAB — CULTURE, ROUTINE-ABSCESS

## 2011-04-04 LAB — POCT I-STAT, CHEM 8
HCT: 28 — ABNORMAL LOW
Potassium: 3.2 — ABNORMAL LOW

## 2011-09-08 DIAGNOSIS — D250 Submucous leiomyoma of uterus: Secondary | ICD-10-CM | POA: Insufficient documentation

## 2013-11-21 DIAGNOSIS — G894 Chronic pain syndrome: Secondary | ICD-10-CM | POA: Insufficient documentation

## 2013-11-21 DIAGNOSIS — D649 Anemia, unspecified: Secondary | ICD-10-CM | POA: Insufficient documentation

## 2013-11-21 DIAGNOSIS — D509 Iron deficiency anemia, unspecified: Secondary | ICD-10-CM | POA: Insufficient documentation

## 2013-11-21 DIAGNOSIS — K5090 Crohn's disease, unspecified, without complications: Secondary | ICD-10-CM | POA: Insufficient documentation

## 2013-11-21 DIAGNOSIS — E876 Hypokalemia: Secondary | ICD-10-CM | POA: Insufficient documentation

## 2013-11-21 DIAGNOSIS — K056 Periodontal disease, unspecified: Secondary | ICD-10-CM | POA: Insufficient documentation

## 2014-08-25 ENCOUNTER — Telehealth: Payer: Self-pay

## 2014-08-25 DIAGNOSIS — Z1239 Encounter for other screening for malignant neoplasm of breast: Secondary | ICD-10-CM | POA: Insufficient documentation

## 2014-08-25 DIAGNOSIS — Z129 Encounter for screening for malignant neoplasm, site unspecified: Secondary | ICD-10-CM | POA: Insufficient documentation

## 2014-08-25 DIAGNOSIS — L989 Disorder of the skin and subcutaneous tissue, unspecified: Secondary | ICD-10-CM | POA: Insufficient documentation

## 2014-08-25 NOTE — Telephone Encounter (Signed)
[facility] 6 pages to GI Historical Provider

## 2014-09-21 DIAGNOSIS — R634 Abnormal weight loss: Secondary | ICD-10-CM | POA: Insufficient documentation

## 2014-09-21 DIAGNOSIS — G4710 Hypersomnia, unspecified: Secondary | ICD-10-CM | POA: Insufficient documentation

## 2014-12-14 DIAGNOSIS — E041 Nontoxic single thyroid nodule: Secondary | ICD-10-CM | POA: Insufficient documentation

## 2014-12-14 DIAGNOSIS — R911 Solitary pulmonary nodule: Secondary | ICD-10-CM | POA: Insufficient documentation

## 2014-12-14 DIAGNOSIS — C73 Malignant neoplasm of thyroid gland: Secondary | ICD-10-CM | POA: Insufficient documentation

## 2015-02-12 DIAGNOSIS — M542 Cervicalgia: Secondary | ICD-10-CM | POA: Insufficient documentation

## 2015-02-12 DIAGNOSIS — R200 Anesthesia of skin: Secondary | ICD-10-CM | POA: Insufficient documentation

## 2015-05-10 DIAGNOSIS — M545 Low back pain, unspecified: Secondary | ICD-10-CM | POA: Insufficient documentation

## 2015-05-10 DIAGNOSIS — R109 Unspecified abdominal pain: Secondary | ICD-10-CM | POA: Insufficient documentation

## 2015-09-26 DIAGNOSIS — J069 Acute upper respiratory infection, unspecified: Secondary | ICD-10-CM | POA: Insufficient documentation

## 2015-12-28 DIAGNOSIS — M461 Sacroiliitis, not elsewhere classified: Secondary | ICD-10-CM | POA: Insufficient documentation

## 2016-01-22 DIAGNOSIS — R350 Frequency of micturition: Secondary | ICD-10-CM | POA: Insufficient documentation

## 2016-01-22 DIAGNOSIS — Z79899 Other long term (current) drug therapy: Secondary | ICD-10-CM | POA: Insufficient documentation

## 2016-01-22 DIAGNOSIS — G43009 Migraine without aura, not intractable, without status migrainosus: Secondary | ICD-10-CM | POA: Insufficient documentation

## 2016-01-22 DIAGNOSIS — G4700 Insomnia, unspecified: Secondary | ICD-10-CM | POA: Insufficient documentation

## 2017-08-10 DIAGNOSIS — M159 Polyosteoarthritis, unspecified: Secondary | ICD-10-CM | POA: Insufficient documentation

## 2017-08-12 DIAGNOSIS — S52501A Unspecified fracture of the lower end of right radius, initial encounter for closed fracture: Secondary | ICD-10-CM | POA: Insufficient documentation

## 2017-08-13 HISTORY — PX: ORIF DISTAL RADIUS FRACTURE: SUR927

## 2018-01-21 HISTORY — PX: THYROIDECTOMY: SHX17

## 2018-02-08 DIAGNOSIS — E890 Postprocedural hypothyroidism: Secondary | ICD-10-CM | POA: Insufficient documentation

## 2018-03-13 DIAGNOSIS — K50119 Crohn's disease of large intestine with unspecified complications: Secondary | ICD-10-CM | POA: Insufficient documentation

## 2018-04-16 DIAGNOSIS — K760 Fatty (change of) liver, not elsewhere classified: Secondary | ICD-10-CM | POA: Insufficient documentation

## 2018-04-16 DIAGNOSIS — N959 Unspecified menopausal and perimenopausal disorder: Secondary | ICD-10-CM | POA: Insufficient documentation

## 2019-07-26 DIAGNOSIS — D509 Iron deficiency anemia, unspecified: Secondary | ICD-10-CM | POA: Insufficient documentation

## 2019-08-24 DIAGNOSIS — E559 Vitamin D deficiency, unspecified: Secondary | ICD-10-CM | POA: Insufficient documentation

## 2023-04-30 DIAGNOSIS — F4323 Adjustment disorder with mixed anxiety and depressed mood: Secondary | ICD-10-CM | POA: Insufficient documentation

## 2023-07-16 DIAGNOSIS — R197 Diarrhea, unspecified: Secondary | ICD-10-CM | POA: Diagnosis not present

## 2023-07-18 DIAGNOSIS — Z79899 Other long term (current) drug therapy: Secondary | ICD-10-CM | POA: Diagnosis not present

## 2023-07-18 DIAGNOSIS — Z79891 Long term (current) use of opiate analgesic: Secondary | ICD-10-CM | POA: Diagnosis not present

## 2023-07-18 DIAGNOSIS — G43909 Migraine, unspecified, not intractable, without status migrainosus: Secondary | ICD-10-CM | POA: Diagnosis not present

## 2023-07-18 DIAGNOSIS — K5090 Crohn's disease, unspecified, without complications: Secondary | ICD-10-CM | POA: Diagnosis not present

## 2023-07-18 DIAGNOSIS — M545 Low back pain, unspecified: Secondary | ICD-10-CM | POA: Diagnosis not present

## 2023-07-22 DIAGNOSIS — Z79899 Other long term (current) drug therapy: Secondary | ICD-10-CM | POA: Diagnosis not present

## 2023-07-23 DIAGNOSIS — D120 Benign neoplasm of cecum: Secondary | ICD-10-CM | POA: Diagnosis not present

## 2023-07-23 DIAGNOSIS — K921 Melena: Secondary | ICD-10-CM | POA: Diagnosis not present

## 2023-07-23 DIAGNOSIS — K648 Other hemorrhoids: Secondary | ICD-10-CM | POA: Diagnosis not present

## 2023-07-23 DIAGNOSIS — K50119 Crohn's disease of large intestine with unspecified complications: Secondary | ICD-10-CM | POA: Diagnosis not present

## 2023-07-23 DIAGNOSIS — K529 Noninfective gastroenteritis and colitis, unspecified: Secondary | ICD-10-CM | POA: Diagnosis not present

## 2023-07-23 DIAGNOSIS — R197 Diarrhea, unspecified: Secondary | ICD-10-CM | POA: Diagnosis not present

## 2023-07-23 DIAGNOSIS — K6289 Other specified diseases of anus and rectum: Secondary | ICD-10-CM | POA: Diagnosis not present

## 2023-08-14 DIAGNOSIS — S0990XA Unspecified injury of head, initial encounter: Secondary | ICD-10-CM | POA: Diagnosis not present

## 2023-08-14 DIAGNOSIS — R519 Headache, unspecified: Secondary | ICD-10-CM | POA: Diagnosis not present

## 2023-08-14 DIAGNOSIS — Z87891 Personal history of nicotine dependence: Secondary | ICD-10-CM | POA: Diagnosis not present

## 2023-08-14 DIAGNOSIS — R220 Localized swelling, mass and lump, head: Secondary | ICD-10-CM | POA: Diagnosis not present

## 2023-08-14 DIAGNOSIS — R918 Other nonspecific abnormal finding of lung field: Secondary | ICD-10-CM | POA: Diagnosis not present

## 2023-08-14 DIAGNOSIS — M791 Myalgia, unspecified site: Secondary | ICD-10-CM | POA: Diagnosis not present

## 2023-08-14 DIAGNOSIS — M542 Cervicalgia: Secondary | ICD-10-CM | POA: Diagnosis not present

## 2023-08-14 DIAGNOSIS — S199XXA Unspecified injury of neck, initial encounter: Secondary | ICD-10-CM | POA: Diagnosis not present

## 2023-08-15 DIAGNOSIS — S199XXA Unspecified injury of neck, initial encounter: Secondary | ICD-10-CM | POA: Diagnosis not present

## 2023-08-15 DIAGNOSIS — R220 Localized swelling, mass and lump, head: Secondary | ICD-10-CM | POA: Diagnosis not present

## 2023-08-15 DIAGNOSIS — S0990XA Unspecified injury of head, initial encounter: Secondary | ICD-10-CM | POA: Diagnosis not present

## 2023-08-15 DIAGNOSIS — R918 Other nonspecific abnormal finding of lung field: Secondary | ICD-10-CM | POA: Diagnosis not present

## 2023-08-19 DIAGNOSIS — Z79891 Long term (current) use of opiate analgesic: Secondary | ICD-10-CM | POA: Diagnosis not present

## 2023-08-19 DIAGNOSIS — G43909 Migraine, unspecified, not intractable, without status migrainosus: Secondary | ICD-10-CM | POA: Diagnosis not present

## 2023-08-19 DIAGNOSIS — K5090 Crohn's disease, unspecified, without complications: Secondary | ICD-10-CM | POA: Diagnosis not present

## 2023-08-19 DIAGNOSIS — M545 Low back pain, unspecified: Secondary | ICD-10-CM | POA: Diagnosis not present

## 2023-08-19 DIAGNOSIS — Z79899 Other long term (current) drug therapy: Secondary | ICD-10-CM | POA: Diagnosis not present

## 2023-09-16 DIAGNOSIS — Z79891 Long term (current) use of opiate analgesic: Secondary | ICD-10-CM | POA: Diagnosis not present

## 2023-09-16 DIAGNOSIS — M545 Low back pain, unspecified: Secondary | ICD-10-CM | POA: Diagnosis not present

## 2023-09-16 DIAGNOSIS — K5090 Crohn's disease, unspecified, without complications: Secondary | ICD-10-CM | POA: Diagnosis not present

## 2023-09-16 DIAGNOSIS — Z79899 Other long term (current) drug therapy: Secondary | ICD-10-CM | POA: Diagnosis not present

## 2023-09-16 DIAGNOSIS — G43909 Migraine, unspecified, not intractable, without status migrainosus: Secondary | ICD-10-CM | POA: Diagnosis not present

## 2023-10-27 DIAGNOSIS — M545 Low back pain, unspecified: Secondary | ICD-10-CM | POA: Diagnosis not present

## 2023-10-27 DIAGNOSIS — Z79899 Other long term (current) drug therapy: Secondary | ICD-10-CM | POA: Diagnosis not present

## 2023-10-27 DIAGNOSIS — K5090 Crohn's disease, unspecified, without complications: Secondary | ICD-10-CM | POA: Diagnosis not present

## 2023-10-27 DIAGNOSIS — G43909 Migraine, unspecified, not intractable, without status migrainosus: Secondary | ICD-10-CM | POA: Diagnosis not present

## 2023-11-17 DIAGNOSIS — D509 Iron deficiency anemia, unspecified: Secondary | ICD-10-CM | POA: Diagnosis not present

## 2023-11-17 DIAGNOSIS — D508 Other iron deficiency anemias: Secondary | ICD-10-CM | POA: Diagnosis not present

## 2023-11-24 DIAGNOSIS — L659 Nonscarring hair loss, unspecified: Secondary | ICD-10-CM | POA: Diagnosis not present

## 2023-11-24 DIAGNOSIS — L810 Postinflammatory hyperpigmentation: Secondary | ICD-10-CM | POA: Diagnosis not present

## 2023-11-25 DIAGNOSIS — E890 Postprocedural hypothyroidism: Secondary | ICD-10-CM | POA: Diagnosis not present

## 2023-11-25 DIAGNOSIS — C73 Malignant neoplasm of thyroid gland: Secondary | ICD-10-CM | POA: Diagnosis not present

## 2023-11-25 DIAGNOSIS — E559 Vitamin D deficiency, unspecified: Secondary | ICD-10-CM | POA: Diagnosis not present

## 2023-11-27 DIAGNOSIS — K5090 Crohn's disease, unspecified, without complications: Secondary | ICD-10-CM | POA: Diagnosis not present

## 2023-11-27 DIAGNOSIS — E890 Postprocedural hypothyroidism: Secondary | ICD-10-CM | POA: Diagnosis not present

## 2023-11-27 DIAGNOSIS — Z79899 Other long term (current) drug therapy: Secondary | ICD-10-CM | POA: Diagnosis not present

## 2023-11-27 DIAGNOSIS — E559 Vitamin D deficiency, unspecified: Secondary | ICD-10-CM | POA: Diagnosis not present

## 2023-11-27 DIAGNOSIS — G43909 Migraine, unspecified, not intractable, without status migrainosus: Secondary | ICD-10-CM | POA: Diagnosis not present

## 2023-11-27 DIAGNOSIS — C73 Malignant neoplasm of thyroid gland: Secondary | ICD-10-CM | POA: Diagnosis not present

## 2023-11-27 DIAGNOSIS — M545 Low back pain, unspecified: Secondary | ICD-10-CM | POA: Diagnosis not present

## 2023-11-27 DIAGNOSIS — Z79891 Long term (current) use of opiate analgesic: Secondary | ICD-10-CM | POA: Diagnosis not present

## 2023-12-03 DIAGNOSIS — Z79899 Other long term (current) drug therapy: Secondary | ICD-10-CM | POA: Diagnosis not present

## 2023-12-07 DIAGNOSIS — D508 Other iron deficiency anemias: Secondary | ICD-10-CM | POA: Diagnosis not present

## 2023-12-14 DIAGNOSIS — D508 Other iron deficiency anemias: Secondary | ICD-10-CM | POA: Diagnosis not present

## 2023-12-21 ENCOUNTER — Encounter: Payer: Self-pay | Admitting: Family Medicine

## 2023-12-21 ENCOUNTER — Ambulatory Visit (INDEPENDENT_AMBULATORY_CARE_PROVIDER_SITE_OTHER): Payer: 59 | Admitting: Family Medicine

## 2023-12-21 VITALS — BP 118/60 | HR 73 | Temp 98.5°F | Ht 72.0 in | Wt 169.5 lb

## 2023-12-21 DIAGNOSIS — F4323 Adjustment disorder with mixed anxiety and depressed mood: Secondary | ICD-10-CM

## 2023-12-21 DIAGNOSIS — C73 Malignant neoplasm of thyroid gland: Secondary | ICD-10-CM

## 2023-12-21 DIAGNOSIS — Z7689 Persons encountering health services in other specified circumstances: Secondary | ICD-10-CM

## 2023-12-21 DIAGNOSIS — R413 Other amnesia: Secondary | ICD-10-CM | POA: Diagnosis not present

## 2023-12-21 DIAGNOSIS — K50119 Crohn's disease of large intestine with unspecified complications: Secondary | ICD-10-CM

## 2023-12-21 DIAGNOSIS — Z23 Encounter for immunization: Secondary | ICD-10-CM | POA: Diagnosis not present

## 2023-12-21 DIAGNOSIS — D508 Other iron deficiency anemias: Secondary | ICD-10-CM

## 2023-12-21 DIAGNOSIS — Z1231 Encounter for screening mammogram for malignant neoplasm of breast: Secondary | ICD-10-CM | POA: Diagnosis not present

## 2023-12-21 DIAGNOSIS — G4709 Other insomnia: Secondary | ICD-10-CM | POA: Diagnosis not present

## 2023-12-21 DIAGNOSIS — Z01419 Encounter for gynecological examination (general) (routine) without abnormal findings: Secondary | ICD-10-CM

## 2023-12-21 NOTE — Progress Notes (Signed)
 Patient Office Visit  Assessment & Plan:  Encounter to establish care  Need for Tdap vaccination  Screening mammogram for breast cancer -     3D Screening Mammogram, Left and Right; Future  Crohn's colitis, unspecified complication (HCC) -     Comprehensive metabolic panel with GFR -     C-reactive protein  Other iron deficiency anemia  Encounter for gynecological examination -     Ambulatory referral to Obstetrics / Gynecology  Adjustment disorder with mixed anxiety and depressed mood  Other insomnia  Memory changes -     Ambulatory referral to Neurology  Papillary thyroid carcinoma (HCC)   Assessment and Plan    Crohn's Disease Chronic Crohn's disease managed by Dr. Lauretha Pontiff. Recent colonoscopy. Aims to manage symptoms to avoid hospitalization. Aware of disease impact and cancer risk. Celebrex provides some pain relief with caution for GI bleeding. - Continue management with Dr. Lauretha Pontiff. - Use Celebrex for pain management as needed, with caution regarding GI side effects.  Pain Management Chronic knee pain with previous fluid drainage. Celebrex provides some relief. Toradol effective but not suitable long-term. Exploring alternative strategies. - Use Celebrex for pain management as needed. - Explore alternative pain management strategies.  Neuropathy Intermittent neuropathy managed with gabapentin as needed. Considering regular use for better symptom management. - Consider regular gabapentin use for neuropathy management.  General Health Maintenance Due for mammogram and tetanus vaccination. Discussed tetanus vaccination importance. - Administer tetanus vaccination. - Order mammogram at [facility] location.  Follow-up Regular follow-ups with endocrinology and hematology. Weekly therapy sessions. Considering neurologist consultation for memory concerns and will do this in [place] - Continue regular follow-ups with endocrinologist and hematologist. -  Continue weekly therapy sessions with Malena Scull. - Consider neurologist consultation in [place] for memory concerns.        Test results were reviewed and analyzed as part of the medical decision making of this visit.  Reviewed previous [facility] and diagnostic studies.  Reviewed previous GI notes and endocrinology notes during the office visit.  Also reviewed previous labs that were done last month. Recommend healthy diet i.e mediterranean/DASH diet, consistent exercise - 30 minutes 5 day per week, and gradual weight loss. 3D mammogram ordered at [facility] in [facility].  Neurology consult ordered with Dr. Janetta Medina in [place].  GYN consult ordered with Dr. Mabel Savage in [facility].  Return in 3 to 4 months or sooner if necessary.  Patient will discuss restarting gabapentin with her pain management physician.  Return in about 3 months (around 03/22/2024), or if symptoms worsen or fail to improve.   Subjective:    Patient ID: [name], female    DOB: October 22, 1970  Age: 53 y.o. MRN: [id number]  No chief complaint on file.   HPI Discussed the use of AI scribe software for clinical note transcription with the patient, who gave verbal consent to proceed.  History of Present Illness        [name] is a 53 year old female with Crohn's disease who presents for a routine follow-up and tetanus vaccination and establish care here.   She has a history of Crohn's disease, which she describes as chronic and affecting multiple organs. She manages her condition to avoid hospitalizations and undergoes annual colonoscopies, with the last one performed by Dr. Townsend Freud. She is currently under Dr. Graham Laws care for Crohn's management. patient used to see Dr. Andra Banco but he is on medical leave  She experiences ongoing abdominal  and joint pain, particularly in the knees, and uses Celebrex, which provides some relief. Toradol is effective for acute  pain. She is under the care of a pain management specialist at [facility]. patient used to see Dr. Tonuzi in [place]  She has a history of iron deficiency anemia due to Crohn's and sees Dr. Lydia Sams for thyroid management  due to thyroid carcinoma every six months.  Iron deficiency- She also consults with a hematologist, Dr. Elnita Hai, who is retiring soon, and is considering Dr. Allyn Ivy as a replacement.  Patient did receive an IV iron infusion June 9.  Adjustment disorder- patient does see therapist once per week in [place]. patient does find this helpful. She reports sleep disturbances, waking up multiple times at night to use the bathroom, and is hesitant to use sleep aids due to concerns about fecal incontinence. She has previously used gabapentin for neuropathy but does not take it regularly.  Her social history includes a daughter attending [employer] and currently working in [place], and a son living with her ex-husband. She is proud of her children's achievements. Physical Exam Results Assessment & Plan Crohn's Disease Chronic Crohn's disease managed by Dr. Emogene Harpin. Recent colonoscopy. Aims to manage symptoms to avoid hospitalization. Aware of disease impact and cancer risk. Celebrex provides some pain relief with caution for GI bleeding. - Continue management with Dr. Emogene Harpin. - Use Celebrex for pain management as needed, with caution regarding GI side effects.  Pain Management Chronic knee pain with previous fluid drainage. Celebrex provides some relief. Toradol effective but not suitable long-term. Exploring alternative strategies. Patient will discuss restarting Gabapentin in the future - Use Celebrex for pain management as needed. - Explore alternative pain management strategies.  Neuropathy Intermittent neuropathy managed with gabapentin as needed. Considering regular use for better symptom management. - Consider regular gabapentin use for neuropathy  management.  General Health Maintenance Due for mammogram and tetanus vaccination. Discussed tetanus vaccination importance. - Administer tetanus vaccination- she will get at the pharmacy. - Order mammogram at [facility] location.  Follow-up Regular follow-ups with endocrinology and hematology. Weekly therapy sessions. Considering neurologist consultation for memory concerns and will do this in [place] - Continue regular follow-ups with endocrinologist and hematologist. - Continue weekly therapy sessions with Malena Scull. - Consider neurologist consultation in [place] for memory concerns.    The ASCVD Risk score (Arnett DK, et al., 2019) failed to calculate for the following reasons:   Cannot find a previous HDL lab   Cannot find a previous total cholesterol lab  Past Medical History:  Diagnosis Date   Anemia    Anxiety    Chronic pain    Colitis    Crohn disease (HCC)    Depression    Insomnia    Past Surgical History:  Procedure Laterality Date   ABDOMINAL HYSTERECTOMY     CESAREAN SECTION     x 3   COLON SURGERY     COLONOSCOPY W/ BIOPSIES     INDUCED ABORTION     MOUTH SURGERY     ORIF DISTAL RADIUS FRACTURE Right 08/13/2017   THYROIDECTOMY  01/21/2018   UPPER GASTROINTESTINAL ENDOSCOPY     Social History   Tobacco Use   Smoking status: Former    Types: Cigarettes   Smokeless tobacco: Never  Vaping Use   Vaping status: Never Used  Substance Use Topics   Alcohol use: Not Currently   Drug use: Never   Family History  Problem Relation Age  of Onset   Diabetes Mother    Hypertension Mother    Allergies  Allergen Reactions   Ibuprofen Other (See Comments)    Other reaction(s): abdominal pain GI upset  Increases inflammation in Crohn's patients    ROS    Objective:    BP 118/60   Pulse 73   Temp 98.5 F (36.9 C)   Ht 6' (1.829 m)   Wt 169 lb 8 oz (76.9 kg)   SpO2 98%   BMI 22.99 kg/m  BP Readings from Last 3 Encounters:  12/21/23  118/60   Wt Readings from Last 3 Encounters:  12/21/23 169 lb 8 oz (76.9 kg)    Physical Exam Vitals and nursing note reviewed.  Constitutional:      Appearance: Normal appearance.  HENT:     Head: Normocephalic.     Right Ear: Tympanic membrane, ear canal and external ear normal.     Left Ear: Tympanic membrane, ear canal and external ear normal.   Eyes:     Extraocular Movements: Extraocular movements intact.     Conjunctiva/sclera: Conjunctivae normal.     Pupils: Pupils are equal, round, and reactive to light.    Cardiovascular:     Rate and Rhythm: Normal rate and regular rhythm.     Heart sounds: Normal heart sounds.  Pulmonary:     Effort: Pulmonary effort is normal.     Breath sounds: Normal breath sounds.   Musculoskeletal:     Right lower leg: No edema.     Left lower leg: No edema.   Neurological:     General: No focal deficit present.     Mental Status: She is alert and oriented to person, place, and time.   Psychiatric:        Mood and Affect: Mood normal.        Behavior: Behavior normal.        Thought Content: Thought content normal.        Judgment: Judgment normal.      No results found for any visits on 12/21/23.

## 2023-12-22 ENCOUNTER — Ambulatory Visit: Payer: Self-pay | Admitting: Family Medicine

## 2023-12-22 ENCOUNTER — Telehealth: Payer: Self-pay | Admitting: Family Medicine

## 2023-12-22 LAB — COMPREHENSIVE METABOLIC PANEL WITH GFR
AG Ratio: 1.3 (calc) (ref 1.0–2.5)
ALT: 11 U/L (ref 6–29)
AST: 9 U/L — ABNORMAL LOW (ref 10–35)
Albumin: 4.3 g/dL (ref 3.6–5.1)
Alkaline phosphatase (APISO): 82 U/L (ref 37–153)
BUN: 14 mg/dL (ref 7–25)
CO2: 28 mmol/L (ref 20–32)
Calcium: 9.4 mg/dL (ref 8.6–10.4)
Chloride: 104 mmol/L (ref 98–110)
Creat: 0.64 mg/dL (ref 0.50–1.03)
Globulin: 3.3 g/dL (ref 1.9–3.7)
Glucose, Bld: 94 mg/dL (ref 65–99)
Potassium: 3.8 mmol/L (ref 3.5–5.3)
Sodium: 140 mmol/L (ref 135–146)
Total Bilirubin: 0.4 mg/dL (ref 0.2–1.2)
Total Protein: 7.6 g/dL (ref 6.1–8.1)
eGFR: 107 mL/min/{1.73_m2} (ref >=60)

## 2023-12-22 LAB — C-REACTIVE PROTEIN: CRP: 9 mg/L — ABNORMAL HIGH (ref <8.0)

## 2023-12-22 NOTE — Telephone Encounter (Signed)
 Copied from CRM [id number]. Topic: Referral - Status >> Dec 21, 2023  1:34 PM Bearl Botts E wrote: Reason for CRM: No availability until end of July/August.   [facility], [place].   Best contact: [phone] Abe Abed

## 2023-12-23 ENCOUNTER — Encounter: Payer: Self-pay | Admitting: Family Medicine

## 2023-12-26 DIAGNOSIS — Z79891 Long term (current) use of opiate analgesic: Secondary | ICD-10-CM | POA: Diagnosis not present

## 2023-12-26 DIAGNOSIS — M545 Low back pain, unspecified: Secondary | ICD-10-CM | POA: Diagnosis not present

## 2023-12-26 DIAGNOSIS — K5090 Crohn's disease, unspecified, without complications: Secondary | ICD-10-CM | POA: Diagnosis not present

## 2023-12-26 DIAGNOSIS — G43909 Migraine, unspecified, not intractable, without status migrainosus: Secondary | ICD-10-CM | POA: Diagnosis not present

## 2023-12-26 DIAGNOSIS — Z79899 Other long term (current) drug therapy: Secondary | ICD-10-CM | POA: Diagnosis not present

## 2024-01-05 DIAGNOSIS — Z1231 Encounter for screening mammogram for malignant neoplasm of breast: Secondary | ICD-10-CM | POA: Diagnosis not present

## 2024-01-05 LAB — HM MAMMOGRAPHY

## 2024-01-19 ENCOUNTER — Encounter: Payer: Self-pay | Admitting: Family Medicine

## 2024-01-23 DIAGNOSIS — Z79899 Other long term (current) drug therapy: Secondary | ICD-10-CM | POA: Diagnosis not present

## 2024-01-23 DIAGNOSIS — M545 Low back pain, unspecified: Secondary | ICD-10-CM | POA: Diagnosis not present

## 2024-01-23 DIAGNOSIS — Z79891 Long term (current) use of opiate analgesic: Secondary | ICD-10-CM | POA: Diagnosis not present

## 2024-01-23 DIAGNOSIS — G43909 Migraine, unspecified, not intractable, without status migrainosus: Secondary | ICD-10-CM | POA: Diagnosis not present

## 2024-01-23 DIAGNOSIS — K5090 Crohn's disease, unspecified, without complications: Secondary | ICD-10-CM | POA: Diagnosis not present

## 2024-02-16 DIAGNOSIS — D509 Iron deficiency anemia, unspecified: Secondary | ICD-10-CM | POA: Diagnosis not present

## 2024-02-25 ENCOUNTER — Ambulatory Visit (INDEPENDENT_AMBULATORY_CARE_PROVIDER_SITE_OTHER): Admitting: *Deleted

## 2024-02-25 VITALS — Ht 72.0 in | Wt 163.0 lb

## 2024-02-25 DIAGNOSIS — Z0000 Encounter for general adult medical examination without abnormal findings: Secondary | ICD-10-CM | POA: Diagnosis not present

## 2024-02-25 NOTE — Progress Notes (Signed)
 Subjective:   [name] is a 53 y.o. female who presents for Medicare Annual (Subsequent) preventive examination.  Visit Complete: Virtual I connected with  [name] on 02/25/24 by a audio enabled telemedicine application and verified that I am speaking with the correct person using two identifiers.  Patient Location: Home  Provider Location: Home Office  I discussed the limitations of evaluation and management by telemedicine. The patient expressed understanding and agreed to proceed.  Vital Signs: Because this visit was a virtual/telehealth visit, some criteria may be missing or patient reported. Any vitals not documented were not able to be obtained and vitals that have been documented are patient reported.   Cardiac Risk Factors include: advanced age (>28men, >50 women);obesity (BMI >30kg/m2)     Objective:    Today's Vitals   02/25/24 0904  Weight: 163 lb (73.9 kg)  Height: 6' (1.829 m)  PainSc: 9    Body mass index is 22.11 kg/m.     02/25/2024    9:10 AM  Advanced Directives  Does Patient Have a Medical Advance Directive? No  Would patient like information on creating a medical advance directive? No - Patient declined    Current Medications (verified) Outpatient Encounter Medications as of 02/25/2024  Medication Sig   celecoxib (CELEBREX) 200 MG capsule Take 200 mg by mouth 2 (two) times daily.   gabapentin (NEURONTIN) 600 MG tablet TAKE ONE TABLET BY MOUTH TWICE DAILY AND TAKE 1 AND 1/2 TABLETS BY MOUTH AT BEDTIME (Patient taking differently: 600 mg.)   hydroquinone 4 % cream Apply topically daily.   liothyronine (CYTOMEL) 5 MCG tablet Take 5 mcg by mouth daily.   mesalamine (ROWASA) 4 g enema Place 4 g rectally at bedtime.   oxyCODONE (OXY IR/ROXICODONE) 5 MG immediate release tablet Take 5 mg by mouth 5 (five) times daily as needed.   SYNTHROID 50 MCG tablet Take 125 mcg by mouth daily before breakfast.   terbinafine (LAMISIL) 250  MG tablet Take 1 tablet by mouth daily.   Vitamin D, Ergocalciferol, (DRISDOL) 1.25 MG (50000 UNIT) CAPS capsule Take 50,000 Units by mouth every 7 (seven) days.   cyclobenzaprine (FLEXERIL) 10 MG tablet Take 10 mg by mouth 2 (two) times daily as needed for muscle spasms. (Patient not taking: Reported on 02/25/2024)   mometasone (NASONEX) 50 MCG/ACT nasal spray Place 2 sprays into the nose daily. (Patient not taking: Reported on 02/25/2024)   No facility-administered encounter medications on file as of 02/25/2024.    Allergies (verified) Ibuprofen   History: Past Medical History:  Diagnosis Date   Anemia    Anxiety    Chronic pain    Colitis    Crohn disease (HCC)    Depression    Insomnia    Past Surgical History:  Procedure Laterality Date   ABDOMINAL HYSTERECTOMY     CESAREAN SECTION     x 3   COLON SURGERY     COLONOSCOPY W/ BIOPSIES     INDUCED ABORTION     MOUTH SURGERY     ORIF DISTAL RADIUS FRACTURE Right 08/13/2017   THYROIDECTOMY  01/21/2018   UPPER GASTROINTESTINAL ENDOSCOPY     Family History  Problem Relation Age of Onset   Diabetes Mother    Hypertension Mother    Social History   Socioeconomic History   Marital status: Married    Spouse name: Not on file   Number of children: Not on file   Years of education: Not on  file   Highest education level: Not on file  Occupational History   Not on file  Tobacco Use   Smoking status: Former    Types: Cigarettes   Smokeless tobacco: Never  Vaping Use   Vaping status: Never Used  Substance and Sexual Activity   Alcohol use: Not Currently   Drug use: Never   Sexual activity: Not Currently  Other Topics Concern   Not on file  Social History Narrative   Not on file   Social Drivers of Health   Financial Resource Strain: Low Risk  (02/25/2024)   Overall Financial Resource Strain (CARDIA)    Difficulty of Paying Living Expenses: Not hard at all  Food Insecurity: Food Insecurity Present (02/25/2024)    Hunger Vital Sign    Worried About Running Out of Food in the Last Year: Sometimes true    Ran Out of Food in the Last Year: Never true  Transportation Needs: No Transportation Needs (02/25/2024)   PRAPARE - Administrator, Civil Service (Medical): No    Lack of Transportation (Non-Medical): No  Physical Activity: Insufficiently Active (02/25/2024)   Exercise Vital Sign    Days of Exercise per Week: 2 days    Minutes of Exercise per Session: 30 min  Stress: Stress Concern Present (02/25/2024)   [employer] of Occupational Health - Occupational Stress Questionnaire    Feeling of Stress: To some extent  Social Connections: Moderately Integrated (02/25/2024)   Social Connection and Isolation Panel    Frequency of Communication with Friends and Family: More than three times a week    Frequency of Social Gatherings with Friends and Family: Twice a week    Attends Religious Services: More than 4 times per year    Active Member of [employer] or Organizations: No    Attends Engineer, structural: Never    Marital Status: Married    Tobacco Counseling Counseling given: Not Answered   Clinical Intake:  Pre-visit preparation completed: Yes  Pain : 0-10 Pain Score: 9  Pain Type: Chronic pain Pain Descriptors / Indicators: Burning, Aching, Dull Pain Onset: More than a month ago Pain Frequency: Constant     Diabetes: No  How often do you need to have someone help you when you read instructions, pamphlets, or other written materials from your doctor or pharmacy?: 1 - Never  Interpreter Needed?: No  Information entered by :: Mliss Graff LPN   Activities of Daily Living    02/25/2024    9:14 AM  In your present state of health, do you have any difficulty performing the following activities:  Hearing? 0  Vision? 0  Difficulty concentrating or making decisions? 0  Walking or climbing stairs? 0  Dressing or bathing? 0  Doing errands, shopping? 0  Preparing Food  and eating ? N  Using the Toilet? N  In the past six months, have you accidently leaked urine? N  Do you have problems with loss of bowel control? N  Managing your Medications? N  Housekeeping or managing your Housekeeping? N    Patient Care Team: Aletha Bene, MD as PCP - General (Family Medicine)  Indicate any recent Medical Services you may have received from other than [facility] providers in the past year (date may be approximate).     Assessment:   This is a routine wellness examination for [name].  Hearing/Vision screen Hearing Screening - Comments:: No trouble hearing Vision Screening - Comments:: Does not have one Education provided  Goals Addressed             This Visit's Progress    Patient Stated       Be more healthy       Depression Screen    02/25/2024    9:17 AM 12/21/2023   11:36 AM  PHQ 2/9 Scores  PHQ - 2 Score 0 1  PHQ- 9 Score 4 10    Fall Risk    02/25/2024    9:06 AM  Fall Risk   Falls in the past year? 0  Number falls in past yr: 0  Injury with Fall? 0  Follow up Falls evaluation completed;Education provided;Falls prevention discussed    MEDICARE RISK AT HOME: Medicare Risk at Home Any stairs in or around the home?: No If so, are there any without handrails?: No Home free of loose throw rugs in walkways, pet beds, electrical cords, etc?: Yes Adequate lighting in your home to reduce risk of falls?: Yes Life alert?: No Use of a cane, walker or w/c?: Yes Grab bars in the bathroom?: No Shower chair or bench in shower?: Yes Elevated toilet seat or a handicapped toilet?: No  TIMED UP AND GO:  Was the test performed?  No    Cognitive Function:        02/25/2024    9:12 AM  6CIT Screen  What Year? 0 points  What month? 0 points  What time? 0 points  Count back from 20 0 points  Months in reverse 0 points  Repeat phrase 0 points  Total Score 0 points    Immunizations Immunization History  Administered Date(s)  Administered   Fluzone Influenza virus vaccine,trivalent (IIV3), split virus 04/04/2009   Influenza, Seasonal, Injecte, Preservative Fre 03/22/2010, 03/15/2012   Influenza,inj,Quad PF,6+ Mos 05/25/2018   Influenza,inj,quad, With Preservative 09/01/2014, 05/10/2015   Influenza-Unspecified 09/01/2014, 05/10/2015   Td 08/02/1990    TDAP status: Due, Education has been provided regarding the importance of this vaccine. Advised may receive this vaccine at local pharmacy or Health Dept. Aware to provide a copy of the vaccination record if obtained from local pharmacy or Health Dept. Verbalized acceptance and understanding.  Flu Vaccine status: Due, Education has been provided regarding the importance of this vaccine. Advised may receive this vaccine at local pharmacy or Health Dept. Aware to provide a copy of the vaccination record if obtained from local pharmacy or Health Dept. Verbalized acceptance and understanding.  Pneumococcal vaccine status: Due, Education has been provided regarding the importance of this vaccine. Advised may receive this vaccine at local pharmacy or Health Dept. Aware to provide a copy of the vaccination record if obtained from local pharmacy or Health Dept. Verbalized acceptance and understanding.  Covid-19 vaccine status: Information provided on how to obtain vaccines.   Qualifies for Shingles Vaccine? Yes   Zostavax completed No   Shingrix Completed?: No.    Education has been provided regarding the importance of this vaccine. Patient has been advised to call insurance company to determine out of pocket expense if they have not yet received this vaccine. Advised may also receive vaccine at local pharmacy or Health Dept. Verbalized acceptance and understanding.  Screening Tests Health Maintenance  Topic Date Due   COVID-19 Vaccine (1) Never done   HIV Screening  Never done   Hepatitis C Screening  Never done   Hepatitis B Vaccines 19-59 Average Risk (1 of 3 - 19+  3-dose series) Never done   Zoster Vaccines- Shingrix (1 of 2)  Never done   DTaP/Tdap/Td (2 - Tdap) 08/02/2000   Cervical Cancer Screening (HPV/Pap Cotest)  Never done   Pneumococcal Vaccine: 50+ Years (1 of 1 - PCV) Never done   INFLUENZA VACCINE  02/05/2024   Medicare Annual Wellness (AWV)  02/24/2025   MAMMOGRAM  01/04/2026   Colonoscopy  07/22/2033   HPV VACCINES  Aged Out   Meningococcal B Vaccine  Aged Out    Health Maintenance  Health Maintenance Due  Topic Date Due   COVID-19 Vaccine (1) Never done   HIV Screening  Never done   Hepatitis C Screening  Never done   Hepatitis B Vaccines 19-59 Average Risk (1 of 3 - 19+ 3-dose series) Never done   Zoster Vaccines- Shingrix (1 of 2) Never done   DTaP/Tdap/Td (2 - Tdap) 08/02/2000   Cervical Cancer Screening (HPV/Pap Cotest)  Never done   Pneumococcal Vaccine: 50+ Years (1 of 1 - PCV) Never done   INFLUENZA VACCINE  02/05/2024    Colonoscopy is following up with [facility]  Mammogram status: Completed 2025. Repeat every year    Lung Cancer Screening: (Low Dose CT Chest recommended if Age 52-80 years, 20 pack-year currently smoking OR have quit w/in 15years.) does not qualify.   Lung Cancer Screening Referral:   Additional Screening:  Hepatitis C Screening: does not qualify  Vision Screening: Recommended annual ophthalmology exams for early detection of glaucoma and other disorders of the eye. Is the patient up to date with their annual eye exam?  No  Who is the provider or what is the name of the office in which the patient attends annual eye exams? Education provided If pt is not established with a provider, would they like to be referred to a provider to establish care? No .   Dental Screening: Recommended annual dental exams for proper oral hygiene    Community Resource Referral / Chronic Care Management: CRR required this visit?  No   CCM required this visit?  No     Plan:     I have personally  reviewed and noted the following in the patient's chart:   Medical and social history Use of alcohol, tobacco or illicit drugs  Current medications and supplements including opioid prescriptions. Patient is currently taking opioid prescriptions. Information provided to patient regarding non-opioid alternatives. Patient advised to discuss non-opioid treatment plan with their provider. Functional ability and status Nutritional status Physical activity Advanced directives List of other physicians Hospitalizations, surgeries, and ER visits in previous 12 months Vitals Screenings to include cognitive, depression, and falls Referrals and appointments  In addition, I have reviewed and discussed with patient certain preventive protocols, quality metrics, and best practice recommendations. A written personalized care plan for preventive services as well as general preventive health recommendations were provided to patient.     Mliss Graff, LPN   [id number]   After Visit Summary: (MyChart) Due to this being a telephonic visit, the after visit summary with patients personalized plan was offered to patient via MyChart   Nurse Notes:

## 2024-02-25 NOTE — Patient Instructions (Signed)
 [name] , Thank you for taking time to come for your Medicare Wellness Visit. I appreciate your ongoing commitment to your health goals. Please review the following plan we discussed and let me know if I can assist you in the future.   Screening recommendations/referrals: Colonoscopy:  Mammogram: up to date  Recommended yearly ophthalmology/optometry visit for glaucoma screening and checkup Recommended yearly dental visit for hygiene and checkup  Vaccinations: Influenza vaccine: Education provided Pneumococcal vaccine: Education provided Tdap vaccine: Education provided Shingles vaccine: Education provided      Preventive Care 40-64 Years and Older, Female Preventive care refers to lifestyle choices and visits with your health care provider that can promote health and wellness. What does preventive care include? A yearly physical exam. This is also called an annual well check. Dental exams once or twice a year. Routine eye exams. Ask your health care provider how often you should have your eyes checked. Personal lifestyle choices, including: Daily care of your teeth and gums. Regular physical activity. Eating a healthy diet. Avoiding tobacco and drug use. Limiting alcohol use. Practicing safe sex. Taking low-dose aspirin every day. Taking vitamin and mineral supplements as recommended by your health care provider. What happens during an annual well check? The services and screenings done by your health care provider during your annual well check will depend on your age, overall health, lifestyle risk factors, and family history of disease. Counseling  Your health care provider may ask you questions about your: Alcohol use. Tobacco use. Drug use. Emotional well-being. Home and relationship well-being. Sexual activity. Eating habits. History of falls. Memory and ability to understand (cognition). Work and work Astronomer. Reproductive health. Screening  You may  have the following tests or measurements: Height, weight, and BMI. Blood pressure. Lipid and cholesterol levels. These may be checked every 5 years, or more frequently if you are over 69 years old. Skin check. Lung cancer screening. You may have this screening every year starting at age 22 if you have a 30-pack-year history of smoking and currently smoke or have quit within the past 15 years. Fecal occult blood test (FOBT) of the stool. You may have this test every year starting at age 71. Flexible sigmoidoscopy or colonoscopy. You may have a sigmoidoscopy every 5 years or a colonoscopy every 10 years starting at age 63. Hepatitis C blood test. Hepatitis B blood test. Sexually transmitted disease (STD) testing. Diabetes screening. This is done by checking your blood sugar (glucose) after you have not eaten for a while (fasting). You may have this done every 1-3 years. Bone density scan. This is done to screen for osteoporosis. You may have this done starting at age 2. Mammogram. This may be done every 1-2 years. Talk to your health care provider about how often you should have regular mammograms. Talk with your health care provider about your test results, treatment options, and if necessary, the need for more tests. Vaccines  Your health care provider may recommend certain vaccines, such as: Influenza vaccine. This is recommended every year. Tetanus, diphtheria, and acellular pertussis (Tdap, Td) vaccine. You may need a Td booster every 10 years. Zoster vaccine. You may need this after age 37. Pneumococcal 13-valent conjugate (PCV13) vaccine. One dose is recommended after age 68. Pneumococcal polysaccharide (PPSV23) vaccine. One dose is recommended after age 29. Talk to your health care provider about which screenings and vaccines you need and how often you need them. This information is not intended to replace advice given to you  by your health care provider. Make sure you discuss any  questions you have with your health care provider. Document Released: 07/20/2015 Document Revised: 03/12/2016 Document Reviewed: 04/24/2015 Elsevier Interactive Patient Education  2017 [employer].  Fall Prevention in the Home Falls can cause injuries. They can happen to people of all ages. There are many things you can do to make your home safe and to help prevent falls. What can I do on the outside of my home? Regularly fix the edges of walkways and driveways and fix any cracks. Remove anything that might make you trip as you walk through a door, such as a raised step or threshold. Trim any bushes or trees on the path to your home. Use bright outdoor lighting. Clear any walking paths of anything that might make someone trip, such as rocks or tools. Regularly check to see if handrails are loose or broken. Make sure that both sides of any steps have handrails. Any raised decks and porches should have guardrails on the edges. Have any leaves, snow, or ice cleared regularly. Use sand or salt on walking paths during winter. Clean up any spills in your garage right away. This includes oil or grease spills. What can I do in the bathroom? Use night lights. Install grab bars by the toilet and in the tub and shower. Do not use towel bars as grab bars. Use non-skid mats or decals in the tub or shower. If you need to sit down in the shower, use a plastic, non-slip stool. Keep the floor dry. Clean up any water that spills on the floor as soon as it happens. Remove soap buildup in the tub or shower regularly. Attach bath mats securely with double-sided non-slip rug tape. Do not have throw rugs and other things on the floor that can make you trip. What can I do in the bedroom? Use night lights. Make sure that you have a light by your bed that is easy to reach. Do not use any sheets or blankets that are too big for your bed. They should not hang down onto the floor. Have a firm chair that has side  arms. You can use this for support while you get dressed. Do not have throw rugs and other things on the floor that can make you trip. What can I do in the kitchen? Clean up any spills right away. Avoid walking on wet floors. Keep items that you use a lot in easy-to-reach places. If you need to reach something above you, use a strong step stool that has a grab bar. Keep electrical cords out of the way. Do not use floor polish or wax that makes floors slippery. If you must use wax, use non-skid floor wax. Do not have throw rugs and other things on the floor that can make you trip. What can I do with my stairs? Do not leave any items on the stairs. Make sure that there are handrails on both sides of the stairs and use them. Fix handrails that are broken or loose. Make sure that handrails are as long as the stairways. Check any carpeting to make sure that it is firmly attached to the stairs. Fix any carpet that is loose or worn. Avoid having throw rugs at the top or bottom of the stairs. If you do have throw rugs, attach them to the floor with carpet tape. Make sure that you have a light switch at the top of the stairs and the bottom of the stairs. If  you do not have them, ask someone to add them for you. What else can I do to help prevent falls? Wear shoes that: Do not have high heels. Have rubber bottoms. Are comfortable and fit you well. Are closed at the toe. Do not wear sandals. If you use a stepladder: Make sure that it is fully opened. Do not climb a closed stepladder. Make sure that both sides of the stepladder are locked into place. Ask someone to hold it for you, if possible. Clearly mark and make sure that you can see: Any grab bars or handrails. First and last steps. Where the edge of each step is. Use tools that help you move around (mobility aids) if they are needed. These include: Canes. Walkers. Scooters. Crutches. Turn on the lights when you go into a dark area.  Replace any light bulbs as soon as they burn out. Set up your furniture so you have a clear path. Avoid moving your furniture around. If any of your floors are uneven, fix them. If there are any pets around you, be aware of where they are. Review your medicines with your doctor. Some medicines can make you feel dizzy. This can increase your chance of falling. Ask your doctor what other things that you can do to help prevent falls. This information is not intended to replace advice given to you by your health care provider. Make sure you discuss any questions you have with your health care provider. Document Released: 04/19/2009 Document Revised: 11/29/2015 Document Reviewed: 07/28/2014 Elsevier Interactive Patient Education  2017 [employer].

## 2024-02-29 NOTE — Progress Notes (Signed)
 [facility] PIE NOTE    Service Start/Stop time: Start time:   8:47 am Stop time:   9:44 am   Treatment Plan Goals: [name] wants to process feelings about her medical condition and learn coping skills to manage symptoms. She will identify 3-5 coping skills.     [name] wants to process feelings related to her children evidenced by patient report.   [name] wants to process feelings about her ex-husband and be able to forgive him as evidenced by patient report.   Purpose: [name] was seen to address treatment goals.  Patient discussed concerns about her daughter.  Patient discussed her concerns about societal influences on her daughter.  She expressed concerns about potential consequences in her decision making and lack of respect for patient.  These issues cause anxiety for patient.    Interventions: Listened, reflected, and provided validation and normalization of patient's experiences.  Employed client centered, supportive, and cognitive psychotherapy interventions.    Effectiveness: Good     Mental Status Exam: Appearance: Normal/appropriate and Good hygiene Gen: Alert and Oriented Mood: Euthymic Affect: Appropriate Sleep: Decreased due to medical issue Appetite: No problem Speech: Clear, normal tone, rate, and volume Thought Process: Logical, goal directed Thought Content: Appropriate Hallucinations: None Intelligence: Above average Insight: Aware of problems Judgment: Managing life situations Suicidal Ideation: Denies suicidal ideation Homicidal Ideation: Denies homicidal ideation   Diagnosis Adjustment Disorder with mixed anxiety and depressed mood    Plan: 1-4 x per month

## 2024-03-01 DIAGNOSIS — L659 Nonscarring hair loss, unspecified: Secondary | ICD-10-CM | POA: Diagnosis not present

## 2024-03-01 NOTE — Progress Notes (Signed)
 Subjective Chief Complaint  Patient presents with  . Alopecia   History of Present Illness: [name] is a 53 y.o. female who presents for ilk injections for alopecia. States over the last few months she has seen new hair growth and is pleased.    Patient denies any other new/changing/growing lesions or non-healing/ulcerated lesions. No other concerns today.  Past medical, social, and family history have been updated and reviewed. Medications and allergies updated and reviewed.   ROS negative for other lumps, bumps or dermatologic concerns. No fever/chills.   Objective Ht 1.829 m (6')   Wt 73 kg (161 lb)   BMI 21.84 kg/m  On physical exam, patient is 53 y.o. female who appears alert and oriented, in no apparent distress, with appropriate mood and affect. Exam was performed today via inspection and palpation of the skin. Exam findings include the following:  Skin Exam 1. ALOPECIA Scalp This Visit - triamcinolone  acetonide (KENALOG) 10 mg/mL injection 7.5 mg     Assessment 1. Alopecia  triamcinolone  acetonide (KENALOG) 10 mg/mL injection 7.5 mg       Plan  The above diagnosis and treatment options were reviewed with the patient. Educated the patient about the benign nature of some of the lesions noted on today's exam. The patient was encouraged to call or send a message through [facility] for any questions or concerns.  Return in about 3 months (around 06/01/2024) for ILK injectins scalp. . - but sooner as needed  Orders Placed This Encounter  Medications  . triamcinolone  acetonide (KENALOG) 10 mg/mL injection 7.5 mg   No orders of the defined types were placed in this encounter.

## 2024-03-14 NOTE — Progress Notes (Signed)
 [facility] [place] PIE NOTE    Service Start/Stop time: Start time:   8:45 am Stop time:   9:53 am   Treatment Plan Goals: Raivyn wants to process feelings about her medical condition and learn coping skills to manage symptoms. She will identify 3-5 coping skills.     Marjory wants to process feelings related to her children evidenced by patient report.   Chace wants to process feelings about her ex-husband and be able to forgive him as evidenced by patient report.   Purpose: Ledonna White-Evans was seen to address treatment goals.  Patient discussed concerns about her daughter.  Patient's daughter is now studying in [place].  She reported her daughter has been communicating with her regularly and sharing positive and negative aspects of study abroad as well as appreciation for her mother's guidance. Patient appeared less anxious today. Patient discussed her concerns about one of her sons.     Interventions: Listened, reflected, and provided validation and normalization of patient's experiences.  Employed client centered, supportive, and cognitive psychotherapy interventions.    Effectiveness: Good     Mental Status Exam: Appearance: Normal/appropriate and Good hygiene Gen: Alert and Oriented Mood: Euthymic Affect: Appropriate Sleep: Decreased due to medical issue Appetite: No problem Speech: Clear, normal tone, rate, and volume Thought Process: Logical, goal directed Thought Content: Appropriate Hallucinations: None Intelligence: Above average Insight: Aware of problems Judgment: Managing life situations Suicidal Ideation: Denies suicidal ideation Homicidal Ideation: Denies homicidal ideation   Diagnosis Adjustment Disorder with mixed anxiety and depressed mood    Plan: 1-4 x per month

## 2024-03-21 NOTE — Progress Notes (Signed)
 [facility] [place] PIE NOTE    Service Start/Stop time: Start time:   8:45 am Stop time:   9:45 am   Treatment Plan Goals: [name] wants to process feelings about her medical condition and learn coping skills to manage symptoms. She will identify 3-5 coping skills.     [name] wants to process feelings related to her children evidenced by patient report.   [name] wants to process feelings about her ex-husband and be able to forgive him as evidenced by patient report.   Purpose: [name] was seen to address treatment goals.  Patient discussed her daughter's experience while in [place].  Patient learned over the weekend that her sister died.  She does not know the cause or circumstances regarding her death.  Patient spoke on family dynamics.  Patient spoke on continuing with her routine and plans even after learning this news.  Patient stated she is OK and waiting to hear about funeral arrangements.  It was her son's and daughter's birthday.  She spent time with her son for his birthday (her daughter is studying abroad).    Interventions: Listened, reflected, and provided validation and normalization of patient's experiences.  Employed client centered, supportive psychotherapy interventions.    Effectiveness: Good     Mental Status Exam: Appearance: Normal/appropriate and Good hygiene Gen: Alert and Oriented Mood: Euthymic Affect: Appropriate Sleep: Decreased due to medical issue Appetite: No problem Speech: Clear, normal tone, rate, and volume Thought Process: Logical, goal directed Thought Content: Appropriate Hallucinations: None Intelligence: Above average Insight: Aware of problems Judgment: Managing life situations Suicidal Ideation: Denies suicidal ideation Homicidal Ideation: Denies homicidal ideation   Diagnosis Adjustment Disorder with mixed anxiety and depressed mood    Plan: 1-4 x per month

## 2024-03-22 ENCOUNTER — Encounter: Payer: Self-pay | Admitting: Family Medicine

## 2024-03-22 ENCOUNTER — Ambulatory Visit: Admitting: Family Medicine

## 2024-03-22 VITALS — BP 120/82 | HR 87 | Temp 98.5°F | Ht 72.0 in | Wt 162.1 lb

## 2024-03-22 DIAGNOSIS — K50119 Crohn's disease of large intestine with unspecified complications: Secondary | ICD-10-CM

## 2024-03-22 DIAGNOSIS — Z780 Asymptomatic menopausal state: Secondary | ICD-10-CM | POA: Diagnosis not present

## 2024-03-22 DIAGNOSIS — C73 Malignant neoplasm of thyroid gland: Secondary | ICD-10-CM

## 2024-03-22 DIAGNOSIS — Z23 Encounter for immunization: Secondary | ICD-10-CM

## 2024-03-22 DIAGNOSIS — D508 Other iron deficiency anemias: Secondary | ICD-10-CM | POA: Diagnosis not present

## 2024-03-22 NOTE — Progress Notes (Signed)
 Patient Office Visit  Assessment & Plan:  Postmenopausal -     Ambulatory referral to Obstetrics / Gynecology  Need for Tdap vaccination  Papillary thyroid carcinoma (HCC)  Crohn's colitis, unspecified complication (HCC)  Other iron deficiency anemia   Assessment and Plan    Crohn's disease with chronic diarrhea Chronic diarrhea linked to Crohn's disease. Discussed non-standard treatments like fecal transplant and nicotine patches, highlighting nicotine patches are not advised for non-smokers. - Consider referral to [facility] for further evaluation if desired.  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 - Continue follow-up with endocrinologist Dr. Tobie. - Discuss potential adjustment of thyroid medication with Dr. Tobie.  Iron deficiency anemia Iron levels improving with current management. - Continue current management and monitoring of iron levels.  Vitamin D deficiency She reports feeling well with current vitamin D regimen. - Continue current vitamin D supplementation and sun exposure.  General Health Maintenance Discussed immunizations; she agreed to tetanus but declined flu, shingles, and pneumonia vaccines. Recent mammogram normal. - Administer tetanus vaccine at pharmacy due to insurance coverage issues. - Encourage consideration of shingles and pneumonia vaccines in the future. - Continue routine mammogram screenings.          No follow-ups on file.   Subjective:    Patient ID: [name], female    DOB: [birth year]  Age: 53 y.o. MRN: [id number]  Chief Complaint  Patient presents with   Medical Management of Chronic Issues    HPI Discussed the use of AI scribe software for clinical note transcription with the patient, who gave verbal consent to proceed.  History of Present Illness         [name] is a 53 year old female who presents for a tetanus shot and follow-up on her mammogram results. Patient cannot get the Tdap here - will get at the pharmacy  She is unable to receive the tetanus shot at the clinic due to insurance coverage issues and will need to obtain it from a pharmacy.  Her mammogram conducted in July at [facility] returned normal and negative results.  She is under the care of Dr. Tobie Endocrinology at [facility] for follow up thyroid cancer,  thyroid management, attending appointments every three months. She feels her thyroid medication requires adjustment due to symptoms like hair loss but he will not increase her dosage. Her last appointment was in May, and she is due for another soon.  She is also receiving pain management from [facility] and has an appointment scheduled for next week.  Chron's disease- was followed by Dr. Timm in [place] but Dr. Timm has been on leave for over a year. She is exploring options for managing her chronic diarrhea, including a fecal transplant. She has read about nicotine patches as a potential treatment for Crohn's disease and colitis but has not pursued this.  She has not received a flu shot since 2019 and is currently opting out of the shingles and pneumonia vaccines.  She resides in [facility] and is experiencing challenges with her hematology care following Dr. Jarold' retirement and Dr. Luisa absence. She has not yet consulted with the new hematologist and not sure who she will be assigned to- she was getting IV iron infusions periodically per Dr. Jarold.   Her daughter is studying in [place], facing peer challenges but benefiting from the educational opportunities. Physical Exam  Results LABS Hemoglobin: 12 (02/2024) Ferritin: Within normal limits (02/2024)  RADIOLOGY Mammogram: Normal (01/2024) Assessment & Plan Crohn's disease with chronic diarrhea Chronic diarrhea linked to Crohn's disease. Discussed  non-standard treatments like fecal transplant and nicotine patches, highlighting nicotine patches are not advised for non-smokers. - Consider referral to [facility] for further evaluation if desired.  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 - Continue follow-up with endocrinologist Dr. Tobie. - Discuss potential adjustment of thyroid medication with Dr. Tobie.  Iron deficiency anemia Iron levels improving with current management. - Continue current management and monitoring of iron levels.  Vitamin D deficiency She reports feeling well with current vitamin D regimen. - Continue current vitamin D supplementation and sun exposure.  General Health Maintenance Discussed immunizations; she agreed to tetanus but declined flu, shingles, and pneumonia vaccines. Recent mammogram normal. - Administer tetanus vaccine at pharmacy due to insurance coverage issues. - Encourage consideration of shingles and pneumonia vaccines in the future. - Continue routine mammogram screenings.-    The ASCVD Risk score (Arnett DK, et al., 2019) failed to calculate for the following reasons:   Cannot find a previous HDL lab   Cannot find a previous total cholesterol lab  Past Medical History:  Diagnosis Date   Anemia    Anxiety    Chronic pain    Colitis    Crohn disease (HCC)    Depression    Insomnia    Past Surgical History:  Procedure Laterality Date   ABDOMINAL HYSTERECTOMY     CESAREAN SECTION     x 3   COLON SURGERY     COLONOSCOPY W/ BIOPSIES     INDUCED ABORTION     MOUTH SURGERY     ORIF DISTAL RADIUS FRACTURE Right 08/13/2017   THYROIDECTOMY  01/21/2018   UPPER GASTROINTESTINAL ENDOSCOPY     Social History   Tobacco Use   Smoking status: Former    Types: Cigarettes   Smokeless tobacco: Never  Vaping Use   Vaping status: Never Used   Substance Use Topics   Alcohol use: Not Currently   Drug use: Never   Family History  Problem Relation Age of Onset   Diabetes Mother    Hypertension Mother    Allergies  Allergen Reactions   Ibuprofen Other (See Comments)    Other reaction(s): abdominal pain GI upset  Increases inflammation in Crohn's patients    ROS    Objective:    BP 120/82   Pulse 87   Temp 98.5 F (36.9 C)   Ht 6' (1.829 m)   Wt 162 lb 2 oz (73.5 kg)   SpO2 98%   BMI 21.99 kg/m  BP Readings from Last 3 Encounters:  03/22/24 120/82  12/21/23 118/60   Wt Readings from Last 3 Encounters:  03/22/24 162 lb 2 oz (73.5 kg)  02/25/24 163 lb (73.9 kg)  12/21/23 169 lb 8 oz (76.9 kg)    Physical Exam Vitals and nursing note reviewed.  Constitutional:      General: She is not in acute distress.    Appearance: Normal appearance.  HENT:     Head: Normocephalic.     Right Ear: Tympanic membrane, ear canal and external ear normal.     Left Ear: Tympanic membrane, ear canal and external ear normal.  Eyes:     Extraocular Movements: Extraocular movements intact.  Pupils: Pupils are equal, round, and reactive to light.  Cardiovascular:     Rate and Rhythm: Normal rate and regular rhythm.     Heart sounds: Normal heart sounds.  Pulmonary:     Effort: Pulmonary effort is normal.     Breath sounds: Normal breath sounds. No wheezing.  Musculoskeletal:     Right lower leg: No edema.     Left lower leg: No edema.  Neurological:     General: No focal deficit present.     Mental Status: She is alert and oriented to person, place, and time.  Psychiatric:        Mood and Affect: Mood normal.        Behavior: Behavior normal.      No results found for any visits on 03/22/24.

## 2024-03-30 DIAGNOSIS — Z79899 Other long term (current) drug therapy: Secondary | ICD-10-CM | POA: Diagnosis not present

## 2024-03-30 DIAGNOSIS — M545 Low back pain, unspecified: Secondary | ICD-10-CM | POA: Diagnosis not present

## 2024-03-30 DIAGNOSIS — Z79891 Long term (current) use of opiate analgesic: Secondary | ICD-10-CM | POA: Diagnosis not present

## 2024-03-30 DIAGNOSIS — G43909 Migraine, unspecified, not intractable, without status migrainosus: Secondary | ICD-10-CM | POA: Diagnosis not present

## 2024-03-30 DIAGNOSIS — K5090 Crohn's disease, unspecified, without complications: Secondary | ICD-10-CM | POA: Diagnosis not present

## 2024-03-31 ENCOUNTER — Encounter: Payer: Self-pay | Admitting: Family Medicine

## 2024-04-04 ENCOUNTER — Ambulatory Visit: Admitting: Family Medicine

## 2024-04-04 ENCOUNTER — Encounter: Payer: Self-pay | Admitting: Family Medicine

## 2024-04-04 VITALS — BP 120/82 | HR 73 | Temp 97.2°F | Ht 72.0 in | Wt 168.0 lb

## 2024-04-04 DIAGNOSIS — G4700 Insomnia, unspecified: Secondary | ICD-10-CM | POA: Diagnosis not present

## 2024-04-04 MED ORDER — DOXEPIN HCL 10 MG PO CAPS: 10.0000 mg | ORAL_CAPSULE | Freq: Every day | ORAL | 0 refills | Status: DC

## 2024-04-04 NOTE — Progress Notes (Signed)
 Patient Office Visit  Assessment & Plan:  Insomnia, unspecified type -     Doxepin HCl; Take 1 capsule (10 mg total) by mouth at bedtime. Take one hour prior  Dispense: 90 capsule; Refill: 0   Assessment and Plan    Insomnia Chronic insomnia for 10 years with ineffective previous treatments. Considering doxepin trial. - Prescribe doxepin for 90 days. - Advise trial for at least a week to assess effectiveness. - Discuss potential dosage adjustment if needed.  Dysthymia Long-standing dysthymia affecting sleep.  General Health Maintenance Discussed flu and tetanus vaccinations. - Advise tetanus vaccine at pharmacy.          Return if symptoms worsen or fail to improve.   Subjective:    Patient ID: [name], female    DOB: 10-Nov-1970  Age: 53 y.o. MRN: [id number]  Chief Complaint  Patient presents with   Insomnia    Pt states that she needs something to sleep.     Insomnia   Discussed the use of AI scribe software for clinical note transcription with the patient, who gave verbal consent to proceed.  History of Present Illness       [name] is a 52 year old female with chronic insomnia who presents with difficulty sleeping.  Patient had sent a message via MyChart which I responded to this morning.  Patient did not have a chance to see it.  She has experienced insomnia for approximately 10 years, trying various treatments such as melatonin, chamomile tea, and chamomile lotion, none of which have been effective. Amitriptyline was effective but caused night terrors, leading to its discontinuation. She is unsure if she has tried trazodone, but does not think she tried doxepin, as the names sound familiar but she does not recall for certain. Gabapentin, taken for other reasons, does not aid her sleep. She has not tried Remeron or Rozerem but would rather try Doxepin right now  Her sleep pattern consists of about three hours of sleep, which she  considers good. She frequently wakes up to use the bathroom due to Crohn's and finds it difficult to return to sleep. She has not had a full night's sleep of five to six hours in about a decade. No sleep apnea is reported, and she sleeps soundly when she does sleep.  Her sister passed away two weeks ago, suspected to be related to drug use, possibly involving fentanyl, with autopsy results pending. She is responsible for organizing the funeral and writing the obituary, adding to her current stress. She needs to get sleep so that she can drive to [place] city  She prefers IV medications over oral forms, particularly for pain management, and dislikes feeling 'high'. She has a history of dysthymia and emphasizes the need for effective sleep to manage her daily responsibilities, including driving. Physical Exam Results Assessment & Plan Insomnia Chronic insomnia for 10 years with ineffective previous treatments. Considering doxepin trial. - Prescribe doxepin for 90 days. - Advise trial for at least a week to assess effectiveness. - Discuss potential dosage adjustment if needed. Patient will let us  know if she needs to increase the dosage.  General Health Maintenance Discussed flu and tetanus vaccinations. - Advise tetanus vaccine at pharmacy.     The ASCVD Risk score (Arnett DK, et al., 2019) failed to calculate for the following reasons:   Cannot find a previous HDL lab   Cannot find a previous total cholesterol lab  Past Medical History:  Diagnosis Date  Anemia    Anxiety    Chronic pain    Colitis    Crohn disease (HCC)    Depression    Insomnia    Past Surgical History:  Procedure Laterality Date   ABDOMINAL HYSTERECTOMY     CESAREAN SECTION     x 3   COLON SURGERY     COLONOSCOPY W/ BIOPSIES     INDUCED ABORTION     MOUTH SURGERY     ORIF DISTAL RADIUS FRACTURE Right 08/13/2017   THYROIDECTOMY  01/21/2018   UPPER GASTROINTESTINAL ENDOSCOPY     Social History    Tobacco Use   Smoking status: Former    Types: Cigarettes   Smokeless tobacco: Never  Vaping Use   Vaping status: Never Used  Substance Use Topics   Alcohol use: Not Currently   Drug use: Never   Family History  Problem Relation Age of Onset   Diabetes Mother    Hypertension Mother    Drug abuse Sister    Allergies  Allergen Reactions   Ibuprofen Other (See Comments)    Other reaction(s): abdominal pain GI upset  Increases inflammation in Crohn's patients    Review of Systems  Psychiatric/Behavioral:  The patient has insomnia.       Objective:    BP 120/82   Pulse 73   Temp (!) 97.2 F (36.2 C)   Ht 6' (1.829 m)   Wt 168 lb (76.2 kg)   SpO2 98%   BMI 22.78 kg/m  BP Readings from Last 3 Encounters:  04/04/24 120/82  03/22/24 120/82  12/21/23 118/60   Wt Readings from Last 3 Encounters:  04/04/24 168 lb (76.2 kg)  03/22/24 162 lb 2 oz (73.5 kg)  02/25/24 163 lb (73.9 kg)    Physical Exam Vitals and nursing note reviewed.  Constitutional:      General: She is not in acute distress.    Appearance: Normal appearance.  HENT:     Head: Normocephalic.     Right Ear: Tympanic membrane, ear canal and external ear normal.     Left Ear: Tympanic membrane, ear canal and external ear normal.  Eyes:     Extraocular Movements: Extraocular movements intact.     Pupils: Pupils are equal, round, and reactive to light.  Cardiovascular:     Rate and Rhythm: Normal rate and regular rhythm.     Heart sounds: Normal heart sounds.  Pulmonary:     Effort: Pulmonary effort is normal.     Breath sounds: Normal breath sounds.  Musculoskeletal:     Right lower leg: No edema.     Left lower leg: No edema.  Neurological:     General: No focal deficit present.     Mental Status: She is alert and oriented to person, place, and time.  Psychiatric:        Mood and Affect: Mood normal.        Behavior: Behavior normal.        Judgment: Judgment normal.      No  results found for any visits on 04/04/24.

## 2024-04-27 NOTE — Progress Notes (Unsigned)
 [facility]  PATIENT: [name] DOB: May 16, 1971  REFERRING DOCTOR OR PCP: Connie Emperor, MD SOURCE: Patient, notes from primary care, imaging and lab reports imaging studies personally reviewed  _________________________________   HISTORICAL  CHIEF COMPLAINT:  Chief Complaint  Patient presents with   New Patient (Initial Visit)    Pt in room 11. Alone.  Internal referral for memory changes. MOCA:30    HISTORY OF PRESENT ILLNESS:  I saw the patient, [facility] for neurologic consultation regarding her concerns about memory  She is a 53 year old woman with migraines, history of Bell's palsy and anxiety who has noted difficulty with STM over the past year..  Besides memory, she has also noted a general brain fog.  Today, she scored 30/30 on the [facility] cognitive assessment which is normal.  I had discussed with her that neurodegenerative causes of memory difficulties are uncommon at age 53 and that reduced focus/attention due to sleep issues, mood disturbance or medical side effects are the most likely cause of these issues.  She does report poor sleep.  She has Crohn's disease and needs to get up several times at night to use the bathroom.  Her bedtime is irregular and has 2-4 sessions of sleep over the day that add up to 4-8 hours a 24 hour cycle.   Ideally, she would want to go to bed around 930 and wake up around 630.  She was prescribed doxepin at night without much benefit.    She also has cyclobenzaprine and feels it might have helped her sleep some.  She usually takes 1-2 naps every day and feels she needs to do so since she sleeps poorly at night.  In the past, hen on a higher dose of opiates, she had hypersomnia..   She reports a sleep study in the past and there was no OSA  She is also on medications that might affect cognition.  Due to pain from Crohn's, she is on oxycodone with some benefit.       She has had some difficulties with mood notes anxiety I'm pretty neurotic.  She goes to therapy weekly.  She had Bells's palsy on the right and has partial weakness when she smiles.   She reports migraines several times a month.  The migraines were more common in the past  She had thyroid cancer and has had a thyroidectomy.  She is on Synthroid and Cytomel.  TSH in May 2025 was less than 0.05     04/28/2024   10:21 AM  Montreal Cognitive Assessment   Visuospatial/ Executive (0/5) 5  Naming (0/3) 3  Attention: Read list of digits (0/2) 2  Attention: Read list of letters (0/1) 1  Attention: Serial 7 subtraction starting at 100 (0/3) 3  Language: Repeat phrase (0/2) 2  Language : Fluency (0/1) 1  Abstraction (0/2) 2  Delayed Recall (0/5) 5  Orientation (0/6) 6  Total 30     Imaging: MRI of the brain 07/17/2018 showed a partially empty sella turcica and was otherwise normal and was otherwise normal.  CT scan of the head 08/15/2023 showed mild right frontal scalp soft tissue swelling.  There was a partially empty sella turcica, unchanged compared to the 2020 MRI).  The brain was normal.  OCT 09/24/2020 showed no evidence of papilledema --- so the partially empty sella is likely incidental  REVIEW OF SYSTEMS: Constitutional: No fevers, chills, sweats, or change in appetite Eyes: No visual changes, double  vision, eye pain Ear, nose and throat: No hearing loss, ear pain, nasal congestion, sore throat Cardiovascular: No chest pain, palpitations Respiratory:  No shortness of breath at rest or with exertion.   No wheezes GastrointestinaI: She has abdominal pain and diarrhea due to Crohn's disease Genitourinary:  No dysuria, urinary retention or frequency.  No nocturia. Musculoskeletal:  No neck pain, back pain Integumentary: No rash, pruritus, skin lesions Neurological: as above Psychiatric: She has anxiety. Endocrine: No palpitations, diaphoresis, change in appetite, change in  weigh or increased thirst Hematologic/Lymphatic:  No anemia, purpura, petechiae. Allergic/Immunologic: No itchy/runny eyes, nasal congestion, recent allergic reactions, rashes  ALLERGIES: Allergies  Allergen Reactions   Ibuprofen Other (See Comments)    Other reaction(s): abdominal pain GI upset  Increases inflammation in Crohn's patients    HOME MEDICATIONS:  Current Outpatient Medications:    celecoxib (CELEBREX) 200 MG capsule, Take 200 mg by mouth 2 (two) times daily., Disp: , Rfl:    gabapentin (NEURONTIN) 600 MG tablet, TAKE ONE TABLET BY MOUTH TWICE DAILY AND TAKE 1 AND 1/2 TABLETS BY MOUTH AT BEDTIME (Patient taking differently: 600 mg.), Disp: , Rfl:    hydroquinone 4 % cream, Apply topically daily., Disp: , Rfl:    liothyronine (CYTOMEL) 5 MCG tablet, Take 5 mcg by mouth daily., Disp: , Rfl:    mesalamine (ROWASA) 4 g enema, Place 4 g rectally at bedtime., Disp: , Rfl:    mometasone (NASONEX) 50 MCG/ACT nasal spray, Place 2 sprays into the nose daily., Disp: , Rfl:    oxyCODONE (OXY IR/ROXICODONE) 5 MG immediate release tablet, Take 5 mg by mouth 5 (five) times daily as needed., Disp: , Rfl:    oxyCODONE-acetaminophen (PERCOCET) 10-325 MG tablet, Take 1 tablet by mouth 5 (five) times daily., Disp: , Rfl:    SYNTHROID 50 MCG tablet, Take 125 mcg by mouth daily before breakfast., Disp: , Rfl:    terbinafine (LAMISIL) 250 MG tablet, Take 1 tablet by mouth daily., Disp: , Rfl:    Vitamin D, Ergocalciferol, (DRISDOL) 1.25 MG (50000 UNIT) CAPS capsule, Take 50,000 Units by mouth every 7 (seven) days., Disp: , Rfl:    cyclobenzaprine (FLEXERIL) 10 MG tablet, Take 1 tablet (10 mg total) by mouth at bedtime., Disp: 30 tablet, Rfl: 5  PAST MEDICAL HISTORY: Past Medical History:  Diagnosis Date   Anemia    Anxiety    Chronic pain    Colitis    Crohn disease (HCC)    Depression    Insomnia     PAST SURGICAL HISTORY: Past Surgical History:  Procedure Laterality Date    ABDOMINAL HYSTERECTOMY     CESAREAN SECTION     x 3   COLON SURGERY     COLONOSCOPY W/ BIOPSIES     INDUCED ABORTION     MOUTH SURGERY     ORIF DISTAL RADIUS FRACTURE Right 08/13/2017   THYROIDECTOMY  01/21/2018   UPPER GASTROINTESTINAL ENDOSCOPY      FAMILY HISTORY: Family History  Problem Relation Age of Onset   Diabetes Mother    Hypertension Mother    Drug abuse Sister     SOCIAL HISTORY: Social History   Socioeconomic History   Marital status: Married    Spouse name: Not on file   Number of children: Not on file   Years of education: Not on file   Highest education level: Not on file  Occupational History   Not on file  Tobacco Use   Smoking status: Former  Types: Cigarettes   Smokeless tobacco: Never  Vaping Use   Vaping status: Never Used  Substance and Sexual Activity   Alcohol use: Not Currently   Drug use: Never   Sexual activity: Not Currently  Other Topics Concern   Not on file  Social History Narrative   Not on file   Social Drivers of Health   Financial Resource Strain: Low Risk  (02/25/2024)   Overall Financial Resource Strain (CARDIA)    Difficulty of Paying Living Expenses: Not hard at all  Food Insecurity: Food Insecurity Present (02/25/2024)   Hunger Vital Sign    Worried About Running Out of Food in the Last Year: Sometimes true    Ran Out of Food in the Last Year: Never true  Transportation Needs: No Transportation Needs (02/25/2024)   PRAPARE - Administrator, Civil Service (Medical): No    Lack of Transportation (Non-Medical): No  Physical Activity: Insufficiently Active (02/25/2024)   Exercise Vital Sign    Days of Exercise per Week: 2 days    Minutes of Exercise per Session: 30 min  Stress: Stress Concern Present (02/25/2024)   [employer] of Occupational Health - Occupational Stress Questionnaire    Feeling of Stress: To some extent  Social Connections: Moderately Integrated (02/25/2024)   Social Connection and  Isolation Panel    Frequency of Communication with Friends and Family: More than three times a week    Frequency of Social Gatherings with Friends and Family: Twice a week    Attends Religious Services: More than 4 times per year    Active Member of [employer] or Organizations: No    Attends Banker Meetings: Never    Marital Status: Married  Catering manager Violence: Not At Risk (02/25/2024)   Humiliation, Afraid, Rape, and Kick questionnaire    Fear of Current or Ex-Partner: No    Emotionally Abused: No    Physically Abused: No    Sexually Abused: No       PHYSICAL EXAM  Vitals:   04/28/24 1019  BP: 103/70  Pulse: 75  Weight: 164 lb 8 oz (74.6 kg)  Height: 6' (1.829 m)    Body mass index is 22.31 kg/m.   General: The patient is well-developed and well-nourished and in no acute distress  HEENT:  Head is [place]/AT.  Sclera are anicteric.  Funduscopic exam shows normal optic discs and retinal vessels.  Neck: No carotid bruits are noted.  The neck is nontender.  Cardiovascular: The heart has a regular rate and rhythm with a normal S1 and S2. There were no murmurs, gallops or rubs.    Skin: Extremities are without rash or  edema.  Musculoskeletal:  Back is nontender  Neurologic Exam  Mental status: The patient is alert and oriented x 3 at the time of the examination. The patient has apparent normal recent and remote memory, with an apparently normal attention span and concentration ability.   Speech is normal.  Cranial nerves: Extraocular movements are full. Pupils are equal, round, and reactive to light and accomodation.  Visual fields are full.  Facial symmetry is present. There is good facial sensation to soft touch bilaterally.Facial strength is normal.  Trapezius and sternocleidomastoid strength is normal. No dysarthria is noted.  The tongue is midline, and the patient has symmetric elevation of the soft palate. No obvious hearing deficits are noted.  Motor:   Muscle bulk is normal.   Tone is normal. Strength is  5 / 5  in all 4 extremities.   Sensory: Sensory testing is intact to pinprick, soft touch and vibration sensation in all 4 extremities.  Coordination: Cerebellar testing reveals good finger-nose-finger and heel-to-shin bilaterally.  Gait and station: Station is normal.   Gait is normal. Tandem gait is normal. Romberg is negative.   Reflexes: Deep tendon reflexes are symmetric and normal bilaterally.   Plantar responses are flexor.    DIAGNOSTIC DATA (LABS, IMAGING, TESTING) - I reviewed patient records, labs, notes, testing and imaging myself where available.  Lab Results  Component Value Date   HGB 9.5 (L) 02/12/2008   HCT 28.0 (L) 02/12/2008      Component Value Date/Time   NA 140 12/21/2023 1211   K 3.8 12/21/2023 1211   CL 104 12/21/2023 1211   CO2 28 12/21/2023 1211   GLUCOSE 94 12/21/2023 1211   BUN 14 12/21/2023 1211   CREATININE 0.64 12/21/2023 1211   CALCIUM 9.4 12/21/2023 1211   PROT 7.6 12/21/2023 1211   AST 9 (L) 12/21/2023 1211   ALT 11 12/21/2023 1211   BILITOT 0.4 12/21/2023 1211      ASSESSMENT AND PLAN  Subjective memory complaints  Migraine without aura and without status migrainosus, not intractable  Crohn's disease with complication, unspecified gastrointestinal tract location (HCC)  Postoperative hypothyroidism  Insomnia, unspecified type   In summary, [name] is a 53 year old woman with subjective memory complaints.  Her examination was normal.  I discussed with her that the majority of the time when somebody of her age has difficulty with memory the problem is due to reduced focus/attention rather than an actual memory disturbance.  I believe this is her issue.  She has several possible sources of reduced focus.  She sleeps poorly having several chunks of sleep rather than a semicontinuous period time.  To help with this, I recommend that she try to set a sleep time and take  melatonin 5 mg and Flexeril 10 mg about 45 minutes before the bedtime.  Hopefully that will help to consolidate her sleep.  She also has an issue with anxiety that might cause increased distractibility.  Additionally she is on oxycodone and has hypothyroidism and these could be playing a role.   I do not think any specific treatment for the memory is needed.  I did not schedule follow-up but she is advised to call if she has severe new or worsening symptoms.  Thank you for asking me to see this patient.  Please let me know if I can be of further assistance with her or other patients in the future.    Lyly Canizales A. Vear, MD, [facility] 04/28/2024, 8:23 PM Certified in Neurology, Clinical Neurophysiology, Sleep Medicine and Neuroimaging  [facility] [address] [place], [place] [address] [phone]

## 2024-04-28 ENCOUNTER — Ambulatory Visit: Admitting: Neurology

## 2024-04-28 ENCOUNTER — Telehealth: Payer: Self-pay | Admitting: *Deleted

## 2024-04-28 ENCOUNTER — Encounter: Payer: Self-pay | Admitting: Neurology

## 2024-04-28 VITALS — BP 103/70 | HR 75 | Ht 72.0 in | Wt 164.5 lb

## 2024-04-28 DIAGNOSIS — G4700 Insomnia, unspecified: Secondary | ICD-10-CM

## 2024-04-28 DIAGNOSIS — E890 Postprocedural hypothyroidism: Secondary | ICD-10-CM | POA: Diagnosis not present

## 2024-04-28 DIAGNOSIS — R4189 Other symptoms and signs involving cognitive functions and awareness: Secondary | ICD-10-CM

## 2024-04-28 DIAGNOSIS — K50919 Crohn's disease, unspecified, with unspecified complications: Secondary | ICD-10-CM | POA: Diagnosis not present

## 2024-04-28 DIAGNOSIS — G43009 Migraine without aura, not intractable, without status migrainosus: Secondary | ICD-10-CM

## 2024-04-28 MED ORDER — CYCLOBENZAPRINE HCL 10 MG PO TABS: 10.0000 mg | ORAL_TABLET | Freq: Every day | ORAL | 5 refills | Status: AC

## 2024-04-29 DIAGNOSIS — K50919 Crohn's disease, unspecified, with unspecified complications: Secondary | ICD-10-CM | POA: Diagnosis not present

## 2024-04-29 DIAGNOSIS — M545 Low back pain, unspecified: Secondary | ICD-10-CM | POA: Diagnosis not present

## 2024-04-29 DIAGNOSIS — Z79899 Other long term (current) drug therapy: Secondary | ICD-10-CM | POA: Diagnosis not present

## 2024-05-02 DIAGNOSIS — K50119 Crohn's disease of large intestine with unspecified complications: Secondary | ICD-10-CM | POA: Diagnosis not present

## 2024-06-06 NOTE — Progress Notes (Signed)
 [facility] [place] PIE NOTE    Service Start/Stop time: Start time:   8:38 am Stop time:   9:35 am    Treatment Plan Goals: [name] wants to process feelings about her medical condition and learn coping skills to manage symptoms. She will identify 3-5 coping skills.     [name] wants to process feelings related to her children evidenced by patient report.   [name] wants to process feelings about her ex-husband and be able to forgive him as evidenced by patient report.   Purpose: [name] was seen in the office today.  She is feeling better.  She has been able to see her doctor.  Patient reported she does not feel her doctor listens to her concerns and she is considering her options for being able to see her old doctor who is at another practice. Patient expressed her feelings related to her children and their perspectives about their childhoods, which they bring up occasionally.  Patient expressed her feelings about their childhood and parenting while she was ill.      Interventions: Listened, reflected, and provided validation and normalization of patient's experiences.  Employed client centered and supportive psychotherapy interventions to help patient process feelings.    Effectiveness: Good     Mental Status Exam: Appearance: Normal/appropriate and Good hygiene Gen: Alert and Oriented Mood: Anxious Affect: Appropriate Sleep: Decreased due to medical issue Appetite: No problem Speech: Clear, normal tone, rate, and volume Thought Process: Logical, goal directed Thought Content: Appropriate Hallucinations: None Intelligence: Above average Insight: Aware of problems Judgment: Managing life situations Suicidal Ideation: Denies suicidal ideation Homicidal Ideation: Denies homicidal ideation   Diagnosis Adjustment Disorder with mixed anxiety and depressed mood    Plan: 1-4 x per month

## 2024-06-21 ENCOUNTER — Ambulatory Visit: Admitting: Family Medicine

## 2024-06-21 ENCOUNTER — Encounter: Payer: Self-pay | Admitting: Family Medicine

## 2024-06-21 VITALS — BP 122/62 | HR 98 | Temp 97.9°F | Ht 72.0 in | Wt 168.5 lb

## 2024-06-21 DIAGNOSIS — G4709 Other insomnia: Secondary | ICD-10-CM

## 2024-06-21 DIAGNOSIS — K50119 Crohn's disease of large intestine with unspecified complications: Secondary | ICD-10-CM

## 2024-06-21 MED ORDER — BELSOMRA 10 MG PO TABS: 10.0000 mg | ORAL_TABLET | Freq: Every evening | ORAL | 0 refills | Status: DC | PRN

## 2024-06-21 NOTE — Progress Notes (Signed)
 Patient Office Visit  Assessment & Plan:  Other insomnia -     Belsomra ; Take 1 tablet (10 mg total) by mouth at bedtime as needed.  Dispense: 30 tablet; Refill: 0  Crohn's colitis, unspecified complication (HCC) -     Ambulatory referral to Gastroenterology   Assessment and Plan    Insomnia Chronic insomnia with temporary relief from previous medications. Current disturbances may affect cognition. - Prescribed Belsomra  10 mg for sleep. - Advised to report if 10 mg is insufficient for dosage adjustment.  Crohn's disease of large intestine with unspecified complications Chronic Crohn's with recent exacerbation. Previous steroids and enemas provided some relief. Concerns about current GI care. - Referred to Dr. Reena for GI care. - Continue enemas for symptom management.  General Health Maintenance Discussion of vaccinations and screenings. Declined flu and pneumonia vaccines. Mammogram completed in July. - Encouraged flu and pneumonia vaccinations due to high risk status.     Patient declined the flu shot and pneumococcal vaccines.  Patient will try the Belsomra  10 mg at nighttime.  If 10 mg is ineffective we can try 15 mg.  Patient will notify us . Consult ordered at [facility] with Dr. Marvis Return in about 3 months (around 09/19/2024), or if symptoms worsen or fail to improve.   Subjective:    Patient ID: [name], female    DOB: 1971-03-28  Age: 53 y.o. MRN: [id number]  Chief Complaint  Patient presents with   Medical Management of Chronic Issues    3 mo f/u    HPI Discussed the use of AI scribe software for clinical note transcription with the patient, who gave verbal consent to proceed.      History of Present Illness [name] is a 53 year old female who presents with sleep disturbances and gastrointestinal issues.  She has been experiencing ongoing sleep disturbances. Initially, doxepin  was effective for three days but then ceased  to work. She also tried Flexeril  in combination with melatonin, which similarly worked for a few days before losing efficacy. She is currently taking Flexeril  10 mg but has stopped when it no longer provided relief.  She reports significant gastrointestinal issues, including frequent diarrhea. She needs to eat every hour to manage her symptoms and mentions going to the bathroom frequently, sometimes every hour or two. Steroids have helped reduce the frequency of her symptoms, and she is also using enemas, which provide some relief by extending the time she can wait before needing to use the bathroom.  She has a history of being evaluated for dementia per Neurology Dr. Vear, which was ruled out by a neurologist. She attributes some of her cognitive concerns to menopause and sleep disturbances. She reports that she scored 30 out of 30 on cognitive tests. She was relieved about this  Her current medications include oxycodone and acetaminophen (Percocet) 10/325 mg, which she does not take five times a day as prescribed. She has been under the care of a pain management doctor at [facility]  She mentions a family history of autoimmune diseases and is concerned about her daughter's health, who has experienced significant weight loss and [place] a [place].  Results Labs Inflammation markers (05/2024): Markedly elevated  Radiology Mammogram (01/2024): Normal  Diagnostic Cognitive assessment (06/21/2024): Score 30/30, normal cognition, no evidence of dementia  Assessment and Plan Insomnia Chronic insomnia with temporary relief from previous medications. Current disturbances may affect cognition. - Prescribed Belsomra  10 mg for sleep. -  Advised to report if 10 mg is insufficient for dosage adjustment.  Crohn's disease of large intestine with unspecified complications Chronic Crohn's with recent exacerbation. Previous steroids and enemas provided some relief. Concerns  about current GI care. - Referred to Dr. Reena for GI care. - Continue enemas for symptom management.  General Health Maintenance Discussion of vaccinations and screenings. Declined flu and pneumonia vaccines. Mammogram completed in July. - Encouraged flu and pneumonia vaccinations due to high risk status.    The ASCVD Risk score (Arnett DK, et al., 2019) failed to calculate for the following reasons:   Cannot find a previous HDL lab   Cannot find a previous total cholesterol lab   * - Cholesterol units were assumed  Past Medical History:  Diagnosis Date   Anemia    Anxiety    Chronic pain    Colitis    Crohn disease (HCC)    Depression    Insomnia    Past Surgical History:  Procedure Laterality Date   ABDOMINAL HYSTERECTOMY     CESAREAN SECTION     x 3   COLON SURGERY     COLONOSCOPY W/ BIOPSIES     INDUCED ABORTION     MOUTH SURGERY     ORIF DISTAL RADIUS FRACTURE Right 08/13/2017   THYROIDECTOMY  01/21/2018   UPPER GASTROINTESTINAL ENDOSCOPY     Social History[1] Family History  Problem Relation Age of Onset   Diabetes Mother    Hypertension Mother    Drug abuse Sister    Allergies[2]  ROS    Objective:    BP 122/62   Pulse 98   Temp 97.9 F (36.6 C)   Ht 6' (1.829 m)   Wt 168 lb 8 oz (76.4 kg)   SpO2 99%   BMI 22.85 kg/m  BP Readings from Last 3 Encounters:  06/21/24 122/62  04/28/24 103/70  04/04/24 120/82   Wt Readings from Last 3 Encounters:  06/21/24 168 lb 8 oz (76.4 kg)  04/28/24 164 lb 8 oz (74.6 kg)  04/04/24 168 lb (76.2 kg)    Physical Exam Vitals and nursing note reviewed.  Constitutional:      Appearance: Normal appearance.  HENT:     Head: Normocephalic.     Right Ear: Tympanic membrane, ear canal and external ear normal.     Left Ear: Tympanic membrane, ear canal and external ear normal.  Eyes:     Extraocular Movements: Extraocular movements intact.     Conjunctiva/sclera: Conjunctivae normal.     Pupils: Pupils  are equal, round, and reactive to light.  Cardiovascular:     Rate and Rhythm: Normal rate and regular rhythm.     Heart sounds: Normal heart sounds.  Pulmonary:     Effort: Pulmonary effort is normal.     Breath sounds: Normal breath sounds.  Musculoskeletal:     Right lower leg: No edema.     Left lower leg: No edema.  Neurological:     General: No focal deficit present.     Mental Status: She is alert and oriented to person, place, and time.  Psychiatric:        Mood and Affect: Mood normal.        Behavior: Behavior normal.        Thought Content: Thought content normal.        Judgment: Judgment normal.      No results found for any visits on 06/21/24.           [  1]  Social History Tobacco Use   Smoking status: Former    Types: Cigarettes   Smokeless tobacco: Never  Vaping Use   Vaping status: Never Used  Substance Use Topics   Alcohol use: Not Currently   Drug use: Never  [2]  Allergies Allergen Reactions   Ibuprofen Other (See Comments)    Other reaction(s): abdominal pain GI upset  Increases inflammation in Crohn's patients

## 2024-06-28 ENCOUNTER — Encounter: Payer: Self-pay | Admitting: Family Medicine

## 2024-07-18 ENCOUNTER — Other Ambulatory Visit: Payer: Self-pay

## 2024-07-18 DIAGNOSIS — G4709 Other insomnia: Secondary | ICD-10-CM

## 2024-07-18 MED ORDER — BELSOMRA 15 MG PO TABS: 15.0000 mg | ORAL_TABLET | Freq: Every evening | ORAL | 1 refills | Status: AC | PRN

## 2025-03-02 ENCOUNTER — Encounter
